# Patient Record
Sex: Female | Born: 1945 | Race: White | Hispanic: No | Marital: Married | Smoking: Never smoker
Health system: Southern US, Community
[De-identification: ages and names within clinical notes are randomized; demographics above are authoritative.]

## PROBLEM LIST (undated history)

## (undated) DIAGNOSIS — C50919 Malignant neoplasm of unspecified site of unspecified female breast: Secondary | ICD-10-CM

## (undated) DIAGNOSIS — R011 Cardiac murmur, unspecified: Secondary | ICD-10-CM

## (undated) DIAGNOSIS — J302 Other seasonal allergic rhinitis: Secondary | ICD-10-CM

## (undated) DIAGNOSIS — I00 Rheumatic fever without heart involvement: Secondary | ICD-10-CM

## (undated) HISTORY — PX: SKIN CANCER EXCISION: SHX779

## (undated) HISTORY — PX: ABDOMINAL HYSTERECTOMY: SHX81

## (undated) HISTORY — PX: TONSILLECTOMY: SUR1361

## (undated) HISTORY — PX: APPENDECTOMY: SHX54

---

## 2015-10-17 DIAGNOSIS — J209 Acute bronchitis, unspecified: Secondary | ICD-10-CM | POA: Diagnosis not present

## 2015-10-17 DIAGNOSIS — Z789 Other specified health status: Secondary | ICD-10-CM | POA: Diagnosis not present

## 2016-05-15 DIAGNOSIS — Z713 Dietary counseling and surveillance: Secondary | ICD-10-CM | POA: Diagnosis not present

## 2016-05-15 DIAGNOSIS — Z299 Encounter for prophylactic measures, unspecified: Secondary | ICD-10-CM | POA: Diagnosis not present

## 2016-05-15 DIAGNOSIS — J019 Acute sinusitis, unspecified: Secondary | ICD-10-CM | POA: Diagnosis not present

## 2016-05-23 DIAGNOSIS — Z6834 Body mass index (BMI) 34.0-34.9, adult: Secondary | ICD-10-CM | POA: Diagnosis not present

## 2016-05-23 DIAGNOSIS — Z299 Encounter for prophylactic measures, unspecified: Secondary | ICD-10-CM | POA: Diagnosis not present

## 2016-05-23 DIAGNOSIS — M549 Dorsalgia, unspecified: Secondary | ICD-10-CM | POA: Diagnosis not present

## 2016-05-23 DIAGNOSIS — K219 Gastro-esophageal reflux disease without esophagitis: Secondary | ICD-10-CM | POA: Diagnosis not present

## 2016-05-27 DIAGNOSIS — M545 Low back pain: Secondary | ICD-10-CM | POA: Diagnosis not present

## 2016-05-27 DIAGNOSIS — Z6834 Body mass index (BMI) 34.0-34.9, adult: Secondary | ICD-10-CM | POA: Diagnosis not present

## 2016-05-27 DIAGNOSIS — Z299 Encounter for prophylactic measures, unspecified: Secondary | ICD-10-CM | POA: Diagnosis not present

## 2016-05-27 DIAGNOSIS — Z789 Other specified health status: Secondary | ICD-10-CM | POA: Diagnosis not present

## 2016-06-02 DIAGNOSIS — M9983 Other biomechanical lesions of lumbar region: Secondary | ICD-10-CM | POA: Diagnosis not present

## 2016-06-02 DIAGNOSIS — M545 Low back pain: Secondary | ICD-10-CM | POA: Diagnosis not present

## 2016-06-02 DIAGNOSIS — Z9889 Other specified postprocedural states: Secondary | ICD-10-CM | POA: Diagnosis not present

## 2016-06-02 DIAGNOSIS — M79604 Pain in right leg: Secondary | ICD-10-CM | POA: Diagnosis not present

## 2016-06-04 DIAGNOSIS — K219 Gastro-esophageal reflux disease without esophagitis: Secondary | ICD-10-CM | POA: Diagnosis not present

## 2016-06-04 DIAGNOSIS — M549 Dorsalgia, unspecified: Secondary | ICD-10-CM | POA: Diagnosis not present

## 2016-06-04 DIAGNOSIS — Z299 Encounter for prophylactic measures, unspecified: Secondary | ICD-10-CM | POA: Diagnosis not present

## 2016-06-04 DIAGNOSIS — Z789 Other specified health status: Secondary | ICD-10-CM | POA: Diagnosis not present

## 2016-06-04 DIAGNOSIS — Z6834 Body mass index (BMI) 34.0-34.9, adult: Secondary | ICD-10-CM | POA: Diagnosis not present

## 2016-06-13 DIAGNOSIS — M545 Low back pain: Secondary | ICD-10-CM | POA: Diagnosis not present

## 2016-07-02 DIAGNOSIS — M5126 Other intervertebral disc displacement, lumbar region: Secondary | ICD-10-CM | POA: Diagnosis not present

## 2016-07-02 DIAGNOSIS — Z01812 Encounter for preprocedural laboratory examination: Secondary | ICD-10-CM | POA: Diagnosis not present

## 2016-07-07 DIAGNOSIS — Z9889 Other specified postprocedural states: Secondary | ICD-10-CM | POA: Diagnosis not present

## 2016-07-07 DIAGNOSIS — M5116 Intervertebral disc disorders with radiculopathy, lumbar region: Secondary | ICD-10-CM | POA: Diagnosis not present

## 2016-07-07 DIAGNOSIS — M5126 Other intervertebral disc displacement, lumbar region: Secondary | ICD-10-CM | POA: Diagnosis not present

## 2016-07-16 DIAGNOSIS — M5116 Intervertebral disc disorders with radiculopathy, lumbar region: Secondary | ICD-10-CM | POA: Diagnosis not present

## 2016-12-08 DIAGNOSIS — H5319 Other subjective visual disturbances: Secondary | ICD-10-CM | POA: Diagnosis not present

## 2016-12-08 DIAGNOSIS — H9319 Tinnitus, unspecified ear: Secondary | ICD-10-CM | POA: Diagnosis not present

## 2016-12-08 DIAGNOSIS — Z6834 Body mass index (BMI) 34.0-34.9, adult: Secondary | ICD-10-CM | POA: Diagnosis not present

## 2016-12-08 DIAGNOSIS — Z1211 Encounter for screening for malignant neoplasm of colon: Secondary | ICD-10-CM | POA: Diagnosis not present

## 2016-12-08 DIAGNOSIS — Z7189 Other specified counseling: Secondary | ICD-10-CM | POA: Diagnosis not present

## 2016-12-08 DIAGNOSIS — R5383 Other fatigue: Secondary | ICD-10-CM | POA: Diagnosis not present

## 2016-12-08 DIAGNOSIS — E559 Vitamin D deficiency, unspecified: Secondary | ICD-10-CM | POA: Diagnosis not present

## 2016-12-08 DIAGNOSIS — E8940 Asymptomatic postprocedural ovarian failure: Secondary | ICD-10-CM | POA: Diagnosis not present

## 2016-12-08 DIAGNOSIS — Z0000 Encounter for general adult medical examination without abnormal findings: Secondary | ICD-10-CM | POA: Diagnosis not present

## 2016-12-08 DIAGNOSIS — Z299 Encounter for prophylactic measures, unspecified: Secondary | ICD-10-CM | POA: Diagnosis not present

## 2016-12-08 DIAGNOSIS — Z1389 Encounter for screening for other disorder: Secondary | ICD-10-CM | POA: Diagnosis not present

## 2016-12-11 DIAGNOSIS — H9313 Tinnitus, bilateral: Secondary | ICD-10-CM | POA: Diagnosis not present

## 2016-12-11 DIAGNOSIS — J0100 Acute maxillary sinusitis, unspecified: Secondary | ICD-10-CM | POA: Diagnosis not present

## 2016-12-11 DIAGNOSIS — J320 Chronic maxillary sinusitis: Secondary | ICD-10-CM | POA: Diagnosis not present

## 2017-01-06 DIAGNOSIS — E2839 Other primary ovarian failure: Secondary | ICD-10-CM | POA: Diagnosis not present

## 2017-03-19 DIAGNOSIS — H43812 Vitreous degeneration, left eye: Secondary | ICD-10-CM | POA: Diagnosis not present

## 2017-03-25 DIAGNOSIS — M76821 Posterior tibial tendinitis, right leg: Secondary | ICD-10-CM | POA: Diagnosis not present

## 2017-03-25 DIAGNOSIS — M25579 Pain in unspecified ankle and joints of unspecified foot: Secondary | ICD-10-CM | POA: Diagnosis not present

## 2017-03-25 DIAGNOSIS — M79671 Pain in right foot: Secondary | ICD-10-CM | POA: Diagnosis not present

## 2017-09-28 DIAGNOSIS — Z299 Encounter for prophylactic measures, unspecified: Secondary | ICD-10-CM | POA: Diagnosis not present

## 2017-09-28 DIAGNOSIS — J069 Acute upper respiratory infection, unspecified: Secondary | ICD-10-CM | POA: Diagnosis not present

## 2017-09-28 DIAGNOSIS — Z789 Other specified health status: Secondary | ICD-10-CM | POA: Diagnosis not present

## 2017-09-28 DIAGNOSIS — Z6835 Body mass index (BMI) 35.0-35.9, adult: Secondary | ICD-10-CM | POA: Diagnosis not present

## 2018-04-06 DIAGNOSIS — Z6835 Body mass index (BMI) 35.0-35.9, adult: Secondary | ICD-10-CM | POA: Diagnosis not present

## 2018-04-06 DIAGNOSIS — Z2821 Immunization not carried out because of patient refusal: Secondary | ICD-10-CM | POA: Diagnosis not present

## 2018-04-06 DIAGNOSIS — B3789 Other sites of candidiasis: Secondary | ICD-10-CM | POA: Diagnosis not present

## 2018-04-06 DIAGNOSIS — Z299 Encounter for prophylactic measures, unspecified: Secondary | ICD-10-CM | POA: Diagnosis not present

## 2018-04-20 DIAGNOSIS — Z299 Encounter for prophylactic measures, unspecified: Secondary | ICD-10-CM | POA: Diagnosis not present

## 2018-04-20 DIAGNOSIS — Z6835 Body mass index (BMI) 35.0-35.9, adult: Secondary | ICD-10-CM | POA: Diagnosis not present

## 2018-04-20 DIAGNOSIS — J069 Acute upper respiratory infection, unspecified: Secondary | ICD-10-CM | POA: Diagnosis not present

## 2018-05-31 DIAGNOSIS — J019 Acute sinusitis, unspecified: Secondary | ICD-10-CM | POA: Diagnosis not present

## 2018-05-31 DIAGNOSIS — Z789 Other specified health status: Secondary | ICD-10-CM | POA: Diagnosis not present

## 2018-05-31 DIAGNOSIS — Z6836 Body mass index (BMI) 36.0-36.9, adult: Secondary | ICD-10-CM | POA: Diagnosis not present

## 2018-05-31 DIAGNOSIS — Z299 Encounter for prophylactic measures, unspecified: Secondary | ICD-10-CM | POA: Diagnosis not present

## 2018-05-31 DIAGNOSIS — B351 Tinea unguium: Secondary | ICD-10-CM | POA: Diagnosis not present

## 2018-06-10 DIAGNOSIS — Z6835 Body mass index (BMI) 35.0-35.9, adult: Secondary | ICD-10-CM | POA: Diagnosis not present

## 2018-06-10 DIAGNOSIS — Z789 Other specified health status: Secondary | ICD-10-CM | POA: Diagnosis not present

## 2018-06-10 DIAGNOSIS — K449 Diaphragmatic hernia without obstruction or gangrene: Secondary | ICD-10-CM | POA: Diagnosis not present

## 2018-06-10 DIAGNOSIS — J069 Acute upper respiratory infection, unspecified: Secondary | ICD-10-CM | POA: Diagnosis not present

## 2018-06-10 DIAGNOSIS — J40 Bronchitis, not specified as acute or chronic: Secondary | ICD-10-CM | POA: Diagnosis not present

## 2018-06-10 DIAGNOSIS — R05 Cough: Secondary | ICD-10-CM | POA: Diagnosis not present

## 2018-06-10 DIAGNOSIS — Z299 Encounter for prophylactic measures, unspecified: Secondary | ICD-10-CM | POA: Diagnosis not present

## 2018-06-10 DIAGNOSIS — R509 Fever, unspecified: Secondary | ICD-10-CM | POA: Diagnosis not present

## 2018-06-15 DIAGNOSIS — B351 Tinea unguium: Secondary | ICD-10-CM | POA: Diagnosis not present

## 2018-10-05 DIAGNOSIS — C4442 Squamous cell carcinoma of skin of scalp and neck: Secondary | ICD-10-CM | POA: Diagnosis not present

## 2018-10-05 DIAGNOSIS — C44629 Squamous cell carcinoma of skin of left upper limb, including shoulder: Secondary | ICD-10-CM | POA: Diagnosis not present

## 2018-10-05 DIAGNOSIS — C44622 Squamous cell carcinoma of skin of right upper limb, including shoulder: Secondary | ICD-10-CM | POA: Diagnosis not present

## 2018-10-05 DIAGNOSIS — C4492 Squamous cell carcinoma of skin, unspecified: Secondary | ICD-10-CM

## 2018-10-05 HISTORY — DX: Squamous cell carcinoma of skin, unspecified: C44.92

## 2018-11-01 DIAGNOSIS — C44622 Squamous cell carcinoma of skin of right upper limb, including shoulder: Secondary | ICD-10-CM | POA: Diagnosis not present

## 2019-04-13 DIAGNOSIS — L080 Pyoderma: Secondary | ICD-10-CM | POA: Diagnosis not present

## 2019-04-13 DIAGNOSIS — L089 Local infection of the skin and subcutaneous tissue, unspecified: Secondary | ICD-10-CM | POA: Diagnosis not present

## 2019-07-29 DIAGNOSIS — L989 Disorder of the skin and subcutaneous tissue, unspecified: Secondary | ICD-10-CM | POA: Diagnosis not present

## 2019-07-29 DIAGNOSIS — Z6834 Body mass index (BMI) 34.0-34.9, adult: Secondary | ICD-10-CM | POA: Diagnosis not present

## 2019-07-29 DIAGNOSIS — Z808 Family history of malignant neoplasm of other organs or systems: Secondary | ICD-10-CM | POA: Diagnosis not present

## 2019-09-12 DIAGNOSIS — R5382 Chronic fatigue, unspecified: Secondary | ICD-10-CM | POA: Diagnosis not present

## 2019-09-12 DIAGNOSIS — E559 Vitamin D deficiency, unspecified: Secondary | ICD-10-CM | POA: Diagnosis not present

## 2019-09-12 DIAGNOSIS — Z8679 Personal history of other diseases of the circulatory system: Secondary | ICD-10-CM | POA: Diagnosis not present

## 2019-09-12 DIAGNOSIS — Z13220 Encounter for screening for lipoid disorders: Secondary | ICD-10-CM | POA: Diagnosis not present

## 2019-09-12 DIAGNOSIS — Z808 Family history of malignant neoplasm of other organs or systems: Secondary | ICD-10-CM | POA: Diagnosis not present

## 2019-09-15 DIAGNOSIS — Z808 Family history of malignant neoplasm of other organs or systems: Secondary | ICD-10-CM | POA: Diagnosis not present

## 2019-09-15 DIAGNOSIS — Z0000 Encounter for general adult medical examination without abnormal findings: Secondary | ICD-10-CM | POA: Diagnosis not present

## 2019-09-15 DIAGNOSIS — E559 Vitamin D deficiency, unspecified: Secondary | ICD-10-CM | POA: Diagnosis not present

## 2019-09-15 DIAGNOSIS — Z6834 Body mass index (BMI) 34.0-34.9, adult: Secondary | ICD-10-CM | POA: Diagnosis not present

## 2019-09-15 DIAGNOSIS — H9313 Tinnitus, bilateral: Secondary | ICD-10-CM | POA: Diagnosis not present

## 2020-01-26 DIAGNOSIS — Z1231 Encounter for screening mammogram for malignant neoplasm of breast: Secondary | ICD-10-CM | POA: Diagnosis not present

## 2020-02-22 DIAGNOSIS — R928 Other abnormal and inconclusive findings on diagnostic imaging of breast: Secondary | ICD-10-CM | POA: Diagnosis not present

## 2020-02-22 DIAGNOSIS — R921 Mammographic calcification found on diagnostic imaging of breast: Secondary | ICD-10-CM | POA: Diagnosis not present

## 2020-02-29 DIAGNOSIS — Z299 Encounter for prophylactic measures, unspecified: Secondary | ICD-10-CM | POA: Diagnosis not present

## 2020-02-29 DIAGNOSIS — J019 Acute sinusitis, unspecified: Secondary | ICD-10-CM | POA: Diagnosis not present

## 2020-08-08 DIAGNOSIS — Z79899 Other long term (current) drug therapy: Secondary | ICD-10-CM | POA: Diagnosis not present

## 2020-08-08 DIAGNOSIS — E559 Vitamin D deficiency, unspecified: Secondary | ICD-10-CM | POA: Diagnosis not present

## 2020-08-08 DIAGNOSIS — R5383 Other fatigue: Secondary | ICD-10-CM | POA: Diagnosis not present

## 2020-10-19 DIAGNOSIS — Z20822 Contact with and (suspected) exposure to covid-19: Secondary | ICD-10-CM | POA: Diagnosis not present

## 2020-10-31 ENCOUNTER — Other Ambulatory Visit: Payer: Self-pay | Admitting: Family Medicine

## 2020-10-31 DIAGNOSIS — R921 Mammographic calcification found on diagnostic imaging of breast: Secondary | ICD-10-CM

## 2020-11-01 ENCOUNTER — Other Ambulatory Visit: Payer: Self-pay | Admitting: Family Medicine

## 2020-11-01 DIAGNOSIS — R921 Mammographic calcification found on diagnostic imaging of breast: Secondary | ICD-10-CM

## 2020-11-09 ENCOUNTER — Ambulatory Visit
Admission: RE | Admit: 2020-11-09 | Discharge: 2020-11-09 | Disposition: A | Discharge status: Home / Self Care | Payer: Medicare Other | Source: Ambulatory Visit | Attending: Family Medicine | Admitting: Family Medicine

## 2020-11-09 ENCOUNTER — Other Ambulatory Visit: Payer: Self-pay

## 2020-11-09 DIAGNOSIS — R921 Mammographic calcification found on diagnostic imaging of breast: Secondary | ICD-10-CM

## 2020-11-16 ENCOUNTER — Other Ambulatory Visit: Payer: Self-pay | Admitting: General Surgery

## 2020-11-16 DIAGNOSIS — C50511 Malignant neoplasm of lower-outer quadrant of right female breast: Secondary | ICD-10-CM

## 2020-11-19 ENCOUNTER — Telehealth: Payer: Self-pay | Admitting: Radiation Oncology

## 2020-11-19 NOTE — Telephone Encounter (Signed)
Called patient to schedule a consultation with Dr. Lisbeth Renshaw. No answer, LMV for a return call.

## 2020-11-20 ENCOUNTER — Telehealth: Payer: Self-pay | Admitting: Hematology

## 2020-11-20 ENCOUNTER — Other Ambulatory Visit: Payer: Self-pay | Admitting: General Surgery

## 2020-11-20 DIAGNOSIS — C50511 Malignant neoplasm of lower-outer quadrant of right female breast: Secondary | ICD-10-CM

## 2020-11-20 DIAGNOSIS — Z170 Estrogen receptor positive status [ER+]: Secondary | ICD-10-CM

## 2020-11-20 NOTE — Telephone Encounter (Signed)
Received a new pt referral from Dr. Barry Dienes for a new dx of breast cancer. Jamie Pope has been cld and scheduled to see Dr. Burr Medico on 6/29 at 3pm. Pt aware to arrive 20 minutes early.

## 2020-11-21 ENCOUNTER — Ambulatory Visit
Admission: RE | Admit: 2020-11-21 | Discharge: 2020-11-21 | Disposition: A | Discharge status: Home / Self Care | Payer: Medicare Other | Source: Ambulatory Visit | Attending: General Surgery | Admitting: General Surgery

## 2020-11-21 ENCOUNTER — Other Ambulatory Visit: Payer: Self-pay

## 2020-11-21 ENCOUNTER — Ambulatory Visit: Payer: Medicare Other | Admitting: Hematology

## 2020-11-21 ENCOUNTER — Encounter (HOSPITAL_COMMUNITY)
Admission: RE | Admit: 2020-11-21 | Discharge: 2020-11-21 | Disposition: A | Discharge status: Home / Self Care | Payer: Medicare Other | Source: Ambulatory Visit | Attending: General Surgery | Admitting: General Surgery

## 2020-11-21 ENCOUNTER — Encounter (HOSPITAL_COMMUNITY): Payer: Self-pay

## 2020-11-21 DIAGNOSIS — C50511 Malignant neoplasm of lower-outer quadrant of right female breast: Secondary | ICD-10-CM

## 2020-11-21 DIAGNOSIS — D0511 Intraductal carcinoma in situ of right breast: Secondary | ICD-10-CM | POA: Insufficient documentation

## 2020-11-21 DIAGNOSIS — Z01812 Encounter for preprocedural laboratory examination: Secondary | ICD-10-CM | POA: Insufficient documentation

## 2020-11-21 DIAGNOSIS — Z170 Estrogen receptor positive status [ER+]: Secondary | ICD-10-CM

## 2020-11-21 HISTORY — DX: Cardiac murmur, unspecified: R01.1

## 2020-11-21 HISTORY — DX: Other seasonal allergic rhinitis: J30.2

## 2020-11-21 HISTORY — DX: Malignant neoplasm of unspecified site of unspecified female breast: C50.919

## 2020-11-21 HISTORY — DX: Rheumatic fever without heart involvement: I00

## 2020-11-21 LAB — CBC
HCT: 35.2 % — ABNORMAL LOW (ref 36.0–46.0)
Hemoglobin: 10.9 g/dL — ABNORMAL LOW (ref 12.0–15.0)
MCH: 25.3 pg — ABNORMAL LOW (ref 26.0–34.0)
MCHC: 31 g/dL (ref 30.0–36.0)
MCV: 81.7 fL (ref 80.0–100.0)
Platelets: 246 10*3/uL (ref 150–400)
RBC: 4.31 MIL/uL (ref 3.87–5.11)
RDW: 14.7 % (ref 11.5–15.5)
WBC: 5.3 10*3/uL (ref 4.0–10.5)
nRBC: 0 % (ref 0.0–0.2)

## 2020-11-21 NOTE — Pre-Procedure Instructions (Signed)
Jamie Pope  11/21/2020     Your procedure is scheduled on Fri., November 23, 2020 from 9:30AM-10:30AM.  Report to [facility] Entrance "A" at 7:30AM  Call this number if you have problems the morning of surgery:  [phone]   Remember:  Do not eat after midnight on June 30th  You may drink clear liquids until 3 hours (6:30AM) prior to surgery time .  Clear liquids allowed are: Water, Juice(no pulp, non-citric), Black Coffee(no dairy or creamer), Clear Tea(no dairy or creamer), Carbonated Beverages, Gatorade, Plain Jell-O, Plain Popsicles.    Take these medicines the morning of surgery with A SIP OF WATER: If Needed: Acetaminophen (TYLENOL)  As of today, STOP taking all Aspirin (unless instructed by your doctor) and Other Aspirin containing products, Vitamins, Fish oils, and Herbal medications. Also stop all NSAIDS i.e. Advil, Ibuprofen, Motrin, Aleve, Anaprox, Naproxen, BC, Goody Powders, and all Supplements.    No Smoking of any kind, Tobacco/Vaping, or Alcohol products 24 hours prior to your procedure. If you use a Cpap at night, you may bring all equipment for your overnight stay.    Day of Surgery:  Do not wear jewelry, make-up.  Do not wear lotions, powders, or perfumes, or deodorant.  Do not shave 48 hours prior to surgery.    Do not bring valuables to the hospital. Do Not wear nail polish, gel polish, artificial nails, or any other type of covering on  natural nails including finger and toenails. If patients have artificial nails, gel coating, etc. that need to be removed by a nail salon please have this removed prior to surgery or surgery may need to be canceled/delayed if the surgeon/ anesthesia feels like the patient is unable to be adequately monitored.  [facility] is not responsible for any belongings or valuables.  Contacts, dentures or bridgework may not be worn into surgery.    For patients admitted to the hospital, discharge time will be determined by  your treatment team.  Patients discharged the day of surgery will not be allowed to drive home, and someone age 51 and over needs to stay with them for 24 hours.  Special instructions:   Oral Hygiene is also important to reduce your risk of infection.  Remember - BRUSH YOUR TEETH THE MORNING OF SURGERY WITH YOUR REGULAR TOOTHPASTE  [place]- Preparing For Surgery  Before surgery, you can play an important role. Because skin is not sterile, your skin needs to be as free of germs as possible. You can reduce the number of germs on your skin by washing with CHG (chlorahexidine gluconate) Soap before surgery.  CHG is an antiseptic cleaner which kills germs and bonds with the skin to continue killing germs even after washing.    Please do not use if you have an allergy to CHG or antibacterial soaps. If your skin becomes reddened/irritated stop using the CHG.  Do not shave (including legs and underarms) for at least 48 hours prior to first CHG shower. It is OK to shave your face.  Please follow these instructions carefully.   Shower the NIGHT BEFORE SURGERY and the MORNING OF SURGERY with CHG.   If you chose to wash your hair, wash your hair first as usual with your normal shampoo.  After you shampoo, rinse your hair and body thoroughly to remove the shampoo.  Use CHG as you would any other liquid soap. You can apply CHG directly to the skin and wash gently with a scrungie  or a clean washcloth.   Apply the CHG Soap to your body ONLY FROM THE NECK DOWN.  Do not use on open wounds or open sores. Avoid contact with your eyes, ears, mouth and genitals (private parts). Wash Face and genitals (private parts)  with your normal soap.  Wash thoroughly, paying special attention to the area where your surgery will be performed.  Thoroughly rinse your body with warm water from the neck down.  DO NOT shower/wash with your normal soap after using and rinsing off the CHG Soap.  Pat yourself dry with a  CLEAN TOWEL.  Wear CLEAN PAJAMAS to bed the night before surgery, wear comfortable clothes the morning of surgery  Place CLEAN SHEETS on your bed the night of your first shower and DO NOT SLEEP WITH PETS.  Reminders: Do not apply any deodorants/lotions.  Please wear clean clothes to the hospital/surgery center.   Remember to brush your teeth WITH YOUR REGULAR TOOTHPASTE.  Please read over the following fact sheets that you were given.

## 2020-11-21 NOTE — Anesthesia Preprocedure Evaluation (Addendum)
Anesthesia Evaluation  Patient identified by MRN, date of birth, ID band Patient awake    Reviewed: Allergy & Precautions, NPO status , Patient's Chart, lab work & pertinent test results  Airway Mallampati: II  TM Distance: >3 FB Neck ROM: Full    Dental  (+) Dental Advisory Given, Edentulous Upper, Missing,    Pulmonary neg pulmonary ROS,    Pulmonary exam normal breath sounds clear to auscultation       Cardiovascular hypertension (186/79 in preop, per pt she is normally 135 SBP), Normal cardiovascular exam Rhythm:Regular Rate:Normal     Neuro/Psych negative neurological ROS  negative psych ROS   GI/Hepatic negative GI ROS, Neg liver ROS,   Endo/Other  negative endocrine ROS  Renal/GU negative Renal ROS  negative genitourinary   Musculoskeletal negative musculoskeletal ROS (+)   Abdominal   Peds negative pediatric ROS (+)  Hematology  (+) Blood dyscrasia, anemia , hct 35.2   Anesthesia Other Findings Right breast ca   Reproductive/Obstetrics negative OB ROS                            Anesthesia Physical Anesthesia Plan  ASA: 2  Anesthesia Plan: General   Post-op Pain Management:    Induction: Intravenous  PONV Risk Score and Plan: 3 and Ondansetron, Dexamethasone, Midazolam and Treatment may vary due to age or medical condition  Airway Management Planned: LMA  Additional Equipment: None  Intra-op Plan:   Post-operative Plan: Extubation in OR  Informed Consent: I have reviewed the patients History and Physical, chart, labs and discussed the procedure including the risks, benefits and alternatives for the proposed anesthesia with the patient or authorized representative who has indicated his/her understanding and acceptance.     Dental advisory given  Plan Discussed with: CRNA  Anesthesia Plan Comments: (PAT note written by Myra Gianotti, PA-C. )      Anesthesia  Quick Evaluation

## 2020-11-21 NOTE — Progress Notes (Signed)
PCP - Dr. Brigitte Pulse [facility]  Cardiologist - Denies  Chest x-ray - Denies  EKG - Denies  Stress Test - Denies  ECHO - Denies  Cardiac Cath - Denies  AICD-na PM-na LOOP-na  Sleep Study - Denies CPAP - Denies  LABS- 11/21/20: CBC  ASA- Denies  ERAS- No  HA1C- Denies  Anesthesia- Yes- per guidelines- PA Ebony Hail made aware  Pt denies having chest pain, sob, or fever at this time. All instructions explained to the pt, with a verbal understanding of the material. Pt agrees to go over the instructions while at home for a better understanding. Pt also instructed to wear a mask and social distance is she has to go out prior to her surgery. The opportunity to ask questions was provided.    Coronavirus Screening  Have you experienced the following symptoms:  Cough yes/no: No Fever (>100.26F)  yes/no: No Runny nose yes/no: No Sore throat yes/no: No Difficulty breathing/shortness of breath  yes/no: No  Have you or a family member traveled in the last 14 days and where? yes/no: No   If the patient indicates "YES" to the above questions, their PAT will be rescheduled to limit the exposure to others and, the surgeon will be notified. THE PATIENT WILL NEED TO BE ASYMPTOMATIC FOR 14 DAYS.   If the patient is not experiencing any of these symptoms, the PAT nurse will instruct them to NOT bring anyone with them to their appointment since they may have these symptoms or traveled as well.   Please remind your patients and families that hospital visitation restrictions are in effect and the importance of the restrictions.

## 2020-11-21 NOTE — Progress Notes (Addendum)
Anesthesia PAT Evaluation:  Case: [id number] Date/Time: 11/23/20 0915   Procedure: RIGHT BREAST LUMPECTOMY WITH RADIOACTIVE SEED LOCALIZATION (Right: Breast)   Anesthesia type: General   Pre-op diagnosis: RIGHT BREAST CANCER   Location: [place] OR ROOM 02 / [place] OR   Surgeons: Stark Klein, MD       DISCUSSION: Patient is a 75 year old female scheduled for the above procedure. She was an add-on to 11/21/20 PAT for seed implant that same day. Patient evaluated during her 11/21/20 PAT visit.   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 + COVID-19 test (05/28/19 and 10/19/20).  Patient reported rheumatic fever as a 75 year old, but not known issues since, other than what sounds like overnight observation at [facility] ~ 2015 after she felt light-headed with chest burning after cleaning with bleach. She reported normal EKG and cardiac testing, likely stress test. She reported murmur no longer heard. I did not auscultate a murmur on exam. Heart RRR. Lungs clear. No carotid bruits noted. Mild pedal edema. She denied chest pain, SOB, syncope. She has stable DOE with activities such as climbing stairs.   Elevated BP at PAT without HTN diagnosis. Reportedly, BP usually not that high but came in rushed to PAT because she thought she was late. Previous BP 136/80 on 11/16/20 at [facility].   Anesthesia team to evaluate on the day of surgery.    VS: BP (!) 173/96   Pulse 70   Temp 36.6 C (Oral)   Resp 18   Ht 5\' 6"  (1.676 m)   Wt 84.5 kg   SpO2 97%   BMI 30.07 kg/m  Provider wore N95 and universal face mask. Patient wore face mask.    PROVIDERS: Monico Blitz, MD is PCP    LABS: Labs reviewed: Acceptable for surgery. (all labs ordered are listed, but only abnormal results are displayed)  Labs Reviewed  CBC    EKG: N/A   CV: She reported  normal cardiac testing (likely stress test) at [facility] ~ 2015.   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    Myra Gianotti, PA-C Surgical Short Stay/Anesthesiology [facility] Phone [phone] [facility] Phone [phone] 11/22/2020 9:29 AM

## 2020-11-23 ENCOUNTER — Ambulatory Visit (HOSPITAL_COMMUNITY): Payer: Medicare Other | Admitting: Vascular Surgery

## 2020-11-23 ENCOUNTER — Encounter: Payer: Self-pay | Admitting: Radiation Oncology

## 2020-11-23 ENCOUNTER — Ambulatory Visit (HOSPITAL_COMMUNITY)
Admission: RE | Admit: 2020-11-23 | Discharge: 2020-11-23 | Disposition: A | Discharge status: Home / Self Care | Payer: Medicare Other | Attending: General Surgery | Admitting: General Surgery

## 2020-11-23 ENCOUNTER — Ambulatory Visit: Admission: RE | Admit: 2020-11-23 | Payer: Medicare Other | Source: Ambulatory Visit

## 2020-11-23 ENCOUNTER — Encounter (HOSPITAL_COMMUNITY): Payer: Self-pay | Admitting: General Surgery

## 2020-11-23 ENCOUNTER — Encounter (HOSPITAL_COMMUNITY)
Admission: RE | Disposition: A | Discharge status: Home / Self Care | Payer: Self-pay | Source: Home / Self Care | Attending: General Surgery

## 2020-11-23 ENCOUNTER — Other Ambulatory Visit: Payer: Self-pay

## 2020-11-23 ENCOUNTER — Ambulatory Visit (HOSPITAL_COMMUNITY): Payer: Medicare Other | Admitting: Anesthesiology

## 2020-11-23 ENCOUNTER — Ambulatory Visit
Admission: RE | Admit: 2020-11-23 | Discharge: 2020-11-23 | Disposition: A | Discharge status: Home / Self Care | Payer: Medicare Other | Source: Ambulatory Visit | Attending: General Surgery | Admitting: General Surgery

## 2020-11-23 DIAGNOSIS — D0511 Intraductal carcinoma in situ of right breast: Secondary | ICD-10-CM | POA: Insufficient documentation

## 2020-11-23 DIAGNOSIS — Z885 Allergy status to narcotic agent status: Secondary | ICD-10-CM | POA: Diagnosis not present

## 2020-11-23 DIAGNOSIS — Z8249 Family history of ischemic heart disease and other diseases of the circulatory system: Secondary | ICD-10-CM | POA: Diagnosis not present

## 2020-11-23 DIAGNOSIS — Z8049 Family history of malignant neoplasm of other genital organs: Secondary | ICD-10-CM | POA: Insufficient documentation

## 2020-11-23 DIAGNOSIS — Z888 Allergy status to other drugs, medicaments and biological substances status: Secondary | ICD-10-CM | POA: Insufficient documentation

## 2020-11-23 DIAGNOSIS — Z8041 Family history of malignant neoplasm of ovary: Secondary | ICD-10-CM | POA: Insufficient documentation

## 2020-11-23 DIAGNOSIS — Z803 Family history of malignant neoplasm of breast: Secondary | ICD-10-CM | POA: Insufficient documentation

## 2020-11-23 DIAGNOSIS — Z170 Estrogen receptor positive status [ER+]: Secondary | ICD-10-CM | POA: Insufficient documentation

## 2020-11-23 DIAGNOSIS — C50911 Malignant neoplasm of unspecified site of right female breast: Secondary | ICD-10-CM | POA: Diagnosis present

## 2020-11-23 HISTORY — PX: BREAST LUMPECTOMY WITH RADIOACTIVE SEED LOCALIZATION: SHX6424

## 2020-11-23 SURGERY — BREAST LUMPECTOMY WITH RADIOACTIVE SEED LOCALIZATION
Anesthesia: General | Site: Breast | Laterality: Right

## 2020-11-23 MED ORDER — PROPOFOL 10 MG/ML IV BOLUS
INTRAVENOUS | Status: AC
  Filled 2020-11-23: qty 20

## 2020-11-23 MED ORDER — CHLORHEXIDINE GLUCONATE 0.12 % MT SOLN: 15.0000 mL | Freq: Once | OROMUCOSAL | Status: AC

## 2020-11-23 MED ORDER — ONDANSETRON HCL 4 MG/2ML IJ SOLN: 4.0000 mg | Freq: Once | INTRAMUSCULAR | Status: DC | PRN

## 2020-11-23 MED ORDER — LIDOCAINE HCL 1 % IJ SOLN
INTRAMUSCULAR | Status: AC
  Filled 2020-11-23: qty 20

## 2020-11-23 MED ORDER — CHLORHEXIDINE GLUCONATE 0.12 % MT SOLN
OROMUCOSAL | Status: AC
  Administered 2020-11-23: 15 mL via OROMUCOSAL
  Filled 2020-11-23: qty 15

## 2020-11-23 MED ORDER — PHENYLEPHRINE HCL (PRESSORS) 10 MG/ML IV SOLN
INTRAVENOUS | Status: DC | PRN
  Administered 2020-11-23 (×5): 80 ug via INTRAVENOUS

## 2020-11-23 MED ORDER — CHLORHEXIDINE GLUCONATE CLOTH 2 % EX PADS: 6.0000 | MEDICATED_PAD | Freq: Once | CUTANEOUS | Status: DC

## 2020-11-23 MED ORDER — ACETAMINOPHEN 500 MG PO TABS: 1000.0000 mg | ORAL_TABLET | Freq: Once | ORAL | Status: AC

## 2020-11-23 MED ORDER — FENTANYL CITRATE (PF) 250 MCG/5ML IJ SOLN
INTRAMUSCULAR | Status: DC | PRN
  Administered 2020-11-23: 50 ug via INTRAVENOUS
  Administered 2020-11-23: 25 ug via INTRAVENOUS

## 2020-11-23 MED ORDER — LIDOCAINE HCL 1 % IJ SOLN
INTRAMUSCULAR | Status: DC | PRN
  Administered 2020-11-23: 30 mL

## 2020-11-23 MED ORDER — LACTATED RINGERS IV SOLN: INTRAVENOUS | Status: DC

## 2020-11-23 MED ORDER — OXYCODONE HCL 5 MG/5ML PO SOLN: 5.0000 mg | Freq: Once | ORAL | Status: DC | PRN

## 2020-11-23 MED ORDER — PROPOFOL 10 MG/ML IV BOLUS
INTRAVENOUS | Status: DC | PRN
  Administered 2020-11-23: 150 mg via INTRAVENOUS
  Administered 2020-11-23: 50 mg via INTRAVENOUS

## 2020-11-23 MED ORDER — ORAL CARE MOUTH RINSE: 15.0000 mL | Freq: Once | OROMUCOSAL | Status: AC

## 2020-11-23 MED ORDER — OXYCODONE HCL 5 MG PO TABS: 5.0000 mg | ORAL_TABLET | Freq: Four times a day (QID) | ORAL | 0 refills | Status: DC | PRN

## 2020-11-23 MED ORDER — CEFAZOLIN SODIUM-DEXTROSE 2-4 GM/100ML-% IV SOLN
2.0000 g | INTRAVENOUS | Status: AC
  Administered 2020-11-23: 2 g via INTRAVENOUS
  Filled 2020-11-23: qty 100

## 2020-11-23 MED ORDER — FENTANYL CITRATE (PF) 100 MCG/2ML IJ SOLN: 25.0000 ug | INTRAMUSCULAR | Status: DC | PRN

## 2020-11-23 MED ORDER — BUPIVACAINE-EPINEPHRINE (PF) 0.25% -1:200000 IJ SOLN
INTRAMUSCULAR | Status: AC
  Filled 2020-11-23: qty 30

## 2020-11-23 MED ORDER — ONDANSETRON HCL 4 MG/2ML IJ SOLN
INTRAMUSCULAR | Status: DC | PRN
  Administered 2020-11-23: 4 mg via INTRAVENOUS

## 2020-11-23 MED ORDER — 0.9 % SODIUM CHLORIDE (POUR BTL) OPTIME
TOPICAL | Status: DC | PRN
  Administered 2020-11-23: 1000 mL

## 2020-11-23 MED ORDER — DEXAMETHASONE SODIUM PHOSPHATE 10 MG/ML IJ SOLN
INTRAMUSCULAR | Status: DC | PRN
  Administered 2020-11-23: 5 mg via INTRAVENOUS

## 2020-11-23 MED ORDER — LIDOCAINE 2% (20 MG/ML) 5 ML SYRINGE
INTRAMUSCULAR | Status: DC | PRN
  Administered 2020-11-23: 60 mg via INTRAVENOUS

## 2020-11-23 MED ORDER — ACETAMINOPHEN 500 MG PO TABS
ORAL_TABLET | ORAL | Status: AC
  Administered 2020-11-23: 1000 mg via ORAL
  Filled 2020-11-23: qty 2

## 2020-11-23 MED ORDER — OXYCODONE HCL 5 MG PO TABS
5.0000 mg | ORAL_TABLET | Freq: Once | ORAL | Status: DC | PRN
Start: 2020-11-23 — End: 2020-11-23

## 2020-11-23 MED ORDER — FENTANYL CITRATE (PF) 250 MCG/5ML IJ SOLN
INTRAMUSCULAR | Status: AC
  Filled 2020-11-23: qty 5

## 2020-11-23 MED ORDER — ACETAMINOPHEN 500 MG PO TABS: 1000.0000 mg | ORAL_TABLET | ORAL | Status: AC

## 2020-11-23 SURGICAL SUPPLY — 43 items
ADH SKN CLS APL DERMABOND .7 (GAUZE/BANDAGES/DRESSINGS) ×1
APL PRP STRL LF DISP 70% ISPRP (MISCELLANEOUS) ×1
BAG COUNTER SPONGE SURGICOUNT (BAG) ×2 IMPLANT
BAG SPNG CNTER NS LX DISP (BAG) ×1
BINDER BREAST LRG (GAUZE/BANDAGES/DRESSINGS) IMPLANT
BINDER BREAST XLRG (GAUZE/BANDAGES/DRESSINGS) ×2 IMPLANT
BLADE SURG 10 STRL SS (BLADE) ×2 IMPLANT
CANISTER SUCT 3000ML PPV (MISCELLANEOUS) IMPLANT
CHLORAPREP W/TINT 26 (MISCELLANEOUS) ×2 IMPLANT
CLIP VESOCCLUDE LG 6/CT (CLIP) ×2 IMPLANT
CLIP VESOCCLUDE MED 6/CT (CLIP) ×2 IMPLANT
COVER PROBE W GEL 5X96 (DRAPES) ×2 IMPLANT
COVER SURGICAL LIGHT HANDLE (MISCELLANEOUS) ×2 IMPLANT
DERMABOND ADVANCED (GAUZE/BANDAGES/DRESSINGS) ×1
DERMABOND ADVANCED .7 DNX12 (GAUZE/BANDAGES/DRESSINGS) ×1 IMPLANT
DEVICE DUBIN SPECIMEN MAMMOGRA (MISCELLANEOUS) ×2 IMPLANT
DRAPE CHEST BREAST 15X10 FENES (DRAPES) ×2 IMPLANT
DRSG PAD ABDOMINAL 8X10 ST (GAUZE/BANDAGES/DRESSINGS) ×2 IMPLANT
ELECT COATED BLADE 2.86 ST (ELECTRODE) ×2 IMPLANT
ELECT REM PT RETURN 9FT ADLT (ELECTROSURGICAL) ×2
ELECTRODE REM PT RTRN 9FT ADLT (ELECTROSURGICAL) ×1 IMPLANT
GAUZE SPONGE 4X4 12PLY STRL LF (GAUZE/BANDAGES/DRESSINGS) ×2 IMPLANT
GLOVE SURG ENC MOIS LTX SZ6 (GLOVE) ×2 IMPLANT
GLOVE SURG UNDER LTX SZ6.5 (GLOVE) ×2 IMPLANT
GOWN STRL REUS W/ TWL LRG LVL3 (GOWN DISPOSABLE) ×1 IMPLANT
GOWN STRL REUS W/TWL 2XL LVL3 (GOWN DISPOSABLE) ×2 IMPLANT
GOWN STRL REUS W/TWL LRG LVL3 (GOWN DISPOSABLE) ×2
KIT BASIN OR (CUSTOM PROCEDURE TRAY) ×2 IMPLANT
KIT MARKER MARGIN INK (KITS) ×2 IMPLANT
LIGHT WAVEGUIDE WIDE FLAT (MISCELLANEOUS) IMPLANT
NEEDLE HYPO 25GX1X1/2 BEV (NEEDLE) ×2 IMPLANT
NS IRRIG 1000ML POUR BTL (IV SOLUTION) IMPLANT
PACK GENERAL/GYN (CUSTOM PROCEDURE TRAY) ×2 IMPLANT
STRIP CLOSURE SKIN 1/2X4 (GAUZE/BANDAGES/DRESSINGS) ×2 IMPLANT
SUT MNCRL AB 4-0 PS2 18 (SUTURE) ×2 IMPLANT
SUT SILK 2 0 SH (SUTURE) IMPLANT
SUT VIC AB 2-0 SH 27 (SUTURE) ×2
SUT VIC AB 2-0 SH 27XBRD (SUTURE) ×1 IMPLANT
SUT VIC AB 3-0 SH 27 (SUTURE) ×2
SUT VIC AB 3-0 SH 27X BRD (SUTURE) ×1 IMPLANT
SYR CONTROL 10ML LL (SYRINGE) ×2 IMPLANT
TOWEL GREEN STERILE (TOWEL DISPOSABLE) ×2 IMPLANT
TOWEL GREEN STERILE FF (TOWEL DISPOSABLE) ×2 IMPLANT

## 2020-11-23 NOTE — Discharge Instructions (Addendum)
[facility] Office Phone Number [phone]  BREAST BIOPSY/ PARTIAL MASTECTOMY: POST OP INSTRUCTIONS  Always review your discharge instruction sheet given to you by the facility where your surgery was performed.  IF YOU HAVE DISABILITY OR FAMILY LEAVE FORMS, YOU MUST BRING THEM TO THE OFFICE FOR PROCESSING.  DO NOT GIVE THEM TO YOUR DOCTOR.  A prescription for pain medication may be given to you upon discharge.  Take your pain medication as prescribed, if needed.  If narcotic pain medicine is not needed, then you may take acetaminophen (Tylenol) or ibuprofen (Advil) as needed. Take your usually prescribed medications unless otherwise directed If you need a refill on your pain medication, please contact your pharmacy.  They will contact our office to request authorization.  Prescriptions will not be filled after 5pm or on week-ends. You should eat very light the first 24 hours after surgery, such as soup, crackers, pudding, etc.  Resume your normal diet the day after surgery. Most patients will experience some swelling and bruising in the breast.  Ice packs and a good support bra will help.  Swelling and bruising can take several days to resolve.  It is common to experience some constipation if taking pain medication after surgery.  Increasing fluid intake and taking a stool softener will usually help or prevent this problem from occurring.  A mild laxative (Milk of Magnesia or Miralax) should be taken according to package directions if there are no bowel movements after 48 hours. Unless discharge instructions indicate otherwise, you may remove your bandages 48 hours after surgery, and you may shower at that time.  You may have steri-strips (small skin tapes) in place directly over the incision.  These strips should be left on the skin for 7-10 days.   Any sutures or staples will be removed at the office during your follow-up visit. ACTIVITIES:  You may resume regular daily activities  (gradually increasing) beginning the next day.  Wearing a good support bra or sports bra (or the breast binder) minimizes pain and swelling.  You may have sexual intercourse when it is comfortable. You may drive when you no longer are taking prescription pain medication, you can comfortably wear a seatbelt, and you can safely maneuver your car and apply brakes. RETURN TO WORK:  __________1 week_______________ You should see your doctor in the office for a follow-up appointment approximately two weeks after your surgery.  Your doctor's nurse will typically make your follow-up appointment when she calls you with your pathology report.  Expect your pathology report 2-3 business days after your surgery.  You may call to check if you do not hear from us after three days.   WHEN TO CALL YOUR DOCTOR: Fever over 101.0 Nausea and/or vomiting. Extreme swelling or bruising. Continued bleeding from incision. Increased pain, redness, or drainage from the incision.  The clinic staff is available to answer your questions during regular business hours.  Please don't hesitate to call and ask to speak to one of the nurses for clinical concerns.  If you have a medical emergency, go to the nearest emergency room or call 911.  A surgeon from [facility] is always on call at the hospital.  For further questions, please visit [email or web address]   

## 2020-11-23 NOTE — Anesthesia Procedure Notes (Signed)
Procedure Name: LMA Insertion Date/Time: 11/23/2020 9:44 AM Performed by: Janace Litten, CRNA Pre-anesthesia Checklist: Patient identified, Emergency Drugs available, Suction available and Patient being monitored Patient Re-evaluated:Patient Re-evaluated prior to induction Oxygen Delivery Method: Circle System Utilized Preoxygenation: Pre-oxygenation with 100% oxygen Induction Type: IV induction LMA: LMA inserted LMA Size: 3.0 Number of attempts: 1 Placement Confirmation: positive ETCO2 and breath sounds checked- equal and bilateral Tube secured with: Tape Dental Injury: Teeth and Oropharynx as per pre-operative assessment

## 2020-11-23 NOTE — Op Note (Signed)
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 The risks, benefits, complications, treatment options, and expected outcomes were discussed with the patient. The possibilities of bleeding, infection, the need for additional procedures, failure to diagnose a condition, and creating a complication requiring other procedures or operations were discussed with the patient. The patient concurred with the proposed plan, giving informed consent.  The site of surgery properly noted/marked. The patient was taken to Operating Room # 2, identified, and the procedure verified as right breast seed localized lumpectomy.  The right breast and chest were prepped and draped in standard fashion. A circumlinear incision was made near the previously placed radioactive seeds.  Dissection was carried down around the point of maximum signal intensity. The cautery was used to perform the dissection.   The specimen was inked with the margin marker paint kit.    Specimen radiography confirmed inclusion of the mammographic lesion, the clip, and the seeds.  The background signal in the breast was zero.  Additional margins were taken on the inferior, medial, superior and posterior margins.  Hemostasis was achieved with cautery.  The cavity was marked with clips on each border other than the anterior border.  Local anesthetic was infiltrated into the surrounding tissues.  The wound was irrigated and closed with 3-0 vicryl interrupted deep dermal sutures and 4-0 monocryl running subcuticular suture.      Sterile dressings were  applied. At the end of the operation, all sponge, instrument, and needle counts were correct.   Findings: Seeds, clip in specimen.  -posterior margin is pectoralis and anterior/inferior margins are skin   Estimated Blood Loss:  min         Specimens: right breast tissue with seed         Complications:  None; patient tolerated the procedure well.         Disposition: PACU - hemodynamically stable.         Condition: stable

## 2020-11-23 NOTE — Anesthesia Postprocedure Evaluation (Signed)
Anesthesia Post Note  Patient: Jamie Pope  Procedure(s) Performed: RIGHT BREAST LUMPECTOMY WITH RADIOACTIVE SEED LOCALIZATION (Right: Breast)     Patient location during evaluation: PACU Anesthesia Type: General Level of consciousness: awake and alert, oriented and patient cooperative Pain management: pain level controlled Vital Signs Assessment: post-procedure vital signs reviewed and stable Respiratory status: spontaneous breathing, nonlabored ventilation and respiratory function stable Cardiovascular status: blood pressure returned to baseline and stable Postop Assessment: no apparent nausea or vomiting Anesthetic complications: no   No notable events documented.  Last Vitals:  Vitals:   11/23/20 1122 11/23/20 1137  BP: (!) 148/71 (!) 157/68  Pulse: 69 62  Resp: 13 13  Temp:  36.8 C  SpO2: 97% 97%    Last Pain:  Vitals:   11/23/20 1137  TempSrc:   PainSc: 0-No pain                 Pervis Hocking

## 2020-11-23 NOTE — Transfer of Care (Addendum)
Immediate Anesthesia Transfer of Care Note  Patient: Jamie Pope  Procedure(s) Performed: RIGHT BREAST LUMPECTOMY WITH RADIOACTIVE SEED LOCALIZATION (Right: Breast)  Patient Location: PACU  Anesthesia Type:General  Level of Consciousness: patient cooperative and responds to stimulation  Airway & Oxygen Therapy: Patient Spontanous Breathing and Patient connected to nasal cannula oxygen  Post-op Assessment: Report given to RN and Post -op Vital signs reviewed and stable  Post vital signs: Reviewed and stable  Last Vitals:  Vitals Value Taken Time  BP 157/68 11/23/20 1137  Temp 36.8 C 11/23/20 1137  Pulse 61 11/23/20 1139  Resp 13 11/23/20 1139  SpO2 98 % 11/23/20 1139  Vitals shown include unvalidated device data.  Last Pain:  Vitals:   11/23/20 1122  TempSrc:   PainSc: Asleep      Patients Stated Pain Goal: 3 (43/60/16 5800)  Complications: No notable events documented.

## 2020-11-23 NOTE — H&P (Signed)
Jamie Pope Appointment: 11/16/2020 9:45 AM Location: [facility] Patient #: [id number] DOB: 12/18/1945 Married / Language: Jamie Pope / Race: White Female   History of Present Illness Stark Klein MD; 11/16/2020 10:49 AM) The patient is a 75 year old female who presents with breast cancer. Pt is a lovely 75 yo F referred by Dr. Autumn Patty for a new right breast cancer 10/2020.  The patient had screening detected calcifications.  Diagnostic imaging showed 3.3 cm of punctate and linear oriented calcs in the LOQ.  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  These were intermediate grade.  She hasn't had cancer before this, but her mother had cancer.  She isn't sure what type, just that it was in her lower abdomen, and her mother was dx at age 52.  Her daughter had ovarian cancer.  She is a G2P2 with first child in her early 67s.  She works as a Scientist, water quality at [employer].    Diagnostic imaging was at [facility] and is described above.    Pathology 11/09/20 1. Breast, right, needle core biopsy, stereotactic of the anterior and posterior - DUCTAL CARCINOMA IN SITU WITH NECROSIS AND CALCIFICATIONS. - SEE MICROSCOPIC DESCRIPTION. 2. Breast, right, needle core biopsy, extent of lower outer quadrant calcifications - DUCTAL CARCINOMA IN SITU WITH NECROSIS AND CALCIFICATIONS. - SEE MICROSCOPIC DESCRIPTION. Microscopic Comment 1. -2. The DCIS has intermediate nuclear grade. Estrogen Receptor: 95%, POSITIVE, STRONG STAINING INTENSITY Progesterone Receptor: 95%, POSITIVE, STRONG STAINING INTENSITY    Past Surgical History Jamie Forehand, CNA; 11/16/2020 9:36 AM) Appendectomy   Breast Biopsy   Right. Foot Surgery   Right. Hysterectomy (not due to cancer) - Complete    Diagnostic Studies History Jamie Forehand, CNA; 11/16/2020 9:36 AM) Mammogram   within last year Pap Smear   1-5 years ago  Allergies Jamie Forehand, CNA; 11/16/2020 9:37  AM) Codeine Sulfate *ANALGESICS - OPIOID*   Allergies Reconciled    Medication History Jamie Forehand, CNA; 11/16/2020 9:37 AM) No Current Medications  Medications Reconciled   Social History Jamie Forehand, CNA; 11/16/2020 9:36 AM) Caffeine use   Coffee. No alcohol use   No drug use   Tobacco use   Never smoker.  Family History Jamie Forehand, CNA; 11/16/2020 9:36 AM) Alcohol Abuse   Father, Sister. Cervical Cancer   Mother. Hypertension   Mother. Ovarian Cancer   Daughter.  Pregnancy / Birth History Jamie Forehand, CNA; 11/16/2020 9:36 AM) Jamie Pope Age   2 Maternal age   21-25 Para   2  Other Problems Jamie Forehand, CNA; 11/16/2020 9:36 AM) Gastroesophageal Reflux Disease   Lump In Breast      Review of Systems Jamie Forehand CNA; 11/16/2020 9:36 AM) General Not Present- Appetite Loss, Chills, Fatigue, Fever, Night Sweats, Weight Gain and Weight Loss. Skin Not Present- Change in Wart/Mole, Dryness, Hives, Jaundice, New Lesions, Non-Healing Wounds, Rash and Ulcer. HEENT Not Present- Earache, Hearing Loss, Hoarseness, Nose Bleed, Oral Ulcers, Ringing in the Ears, Seasonal Allergies, Sinus Pain, Sore Throat, Visual Disturbances, Wears glasses/contact lenses and Yellow Eyes. Respiratory Not Present- Bloody sputum, Chronic Cough, Difficulty Breathing, Snoring and Wheezing. Breast Present- Breast Mass. Not Present- Breast Pain, Nipple Discharge and Skin Changes. Cardiovascular Not Present- Chest Pain, Difficulty Breathing Lying Down, Leg Cramps, Palpitations, Rapid Heart Rate, Shortness of Breath and Swelling of Extremities. Gastrointestinal Not Present- Abdominal Pain, Bloating, Bloody Stool, Change in Bowel Habits, Chronic diarrhea, Constipation, Difficulty Swallowing, Excessive gas, Gets full quickly at  meals, Hemorrhoids, Indigestion, Nausea, Rectal Pain and Vomiting. Female Genitourinary Not Present- Frequency, Nocturia, Painful Urination, Pelvic Pain and  Urgency. Neurological Not Present- Decreased Memory, Fainting, Headaches, Numbness, Seizures, Tingling, Tremor, Trouble walking and Weakness. Psychiatric Not Present- Anxiety, Bipolar, Change in Sleep Pattern, Depression, Fearful and Frequent crying. Endocrine Not Present- Cold Intolerance, Excessive Hunger, Hair Changes, Heat Intolerance, Hot flashes and New Diabetes.  Vitals Adriana Reams Alston CNA; 11/16/2020 9:38 AM) 11/16/2020 9:37 AM Weight: 187.5 lb   Height: 64 in  Body Surface Area: 1.9 m   Body Mass Index: 32.18 kg/m   Temp.: 98 F    Pulse: 80 (Regular)    P.OX: 96% (Room air) BP: 136/80(Sitting, Left Arm, Standard)       Physical Exam Stark Klein MD; 11/16/2020 10:49 AM) General Mental Status - Alert. General Appearance - Consistent with stated age. Hydration - Well hydrated. Voice - Normal.  Head and Neck Head - normocephalic, atraumatic with no lesions or palpable masses. Trachea - midline. Thyroid Gland Characteristics - normal size and consistency.  Eye Eyeball - Bilateral - Extraocular movements intact. Sclera/Conjunctiva - Bilateral - No scleral icterus.  Chest and Lung Exam Chest and lung exam reveals  - quiet, even and easy respiratory effort with no use of accessory muscles and on auscultation, normal breath sounds, no adventitious sounds and normal vocal resonance. Inspection Chest Wall - Normal. Back - normal.  Breast Note:  ptotic breasts bilaterally. right breast sl larger. bruising LOQ on right breast. no nipple retraction or nipple discharge.   Cardiovascular Cardiovascular examination reveals  - normal heart sounds, regular rate and rhythm with no murmurs and normal pedal pulses bilaterally.  Abdomen Inspection Inspection of the abdomen reveals - No Hernias. Palpation/Percussion Palpation and Percussion of the abdomen reveal - Soft, Non Tender, No Rebound tenderness, No Rigidity (guarding) and No  hepatosplenomegaly. Auscultation Auscultation of the abdomen reveals - Bowel sounds normal.  Neurologic Neurologic evaluation reveals  - alert and oriented x 3 with no impairment of recent or remote memory. Mental Status - Normal.  Musculoskeletal Global Assessment  - Note:  no gross deformities.  Normal Exam - Left - Upper Extremity Strength Normal and Lower Extremity Strength Normal. Normal Exam - Right - Upper Extremity Strength Normal and Lower Extremity Strength Normal.  Lymphatic Head & Neck  General Head & Neck Lymphatics: Bilateral - Description - Normal. Axillary  General Axillary Region: Bilateral - Description - Normal. Tenderness - Non Tender. Femoral & Inguinal  Generalized Femoral & Inguinal Lymphatics: Bilateral - Description - No Generalized lymphadenopathy.    Assessment & Plan Stark Klein MD; 11/16/2020 10:53 AM) MALIGNANT NEOPLASM OF LOWER-OUTER QUADRANT OF RIGHT BREAST OF FEMALE, ESTROGEN RECEPTOR POSITIVE (C50.511) Impression: Pt has a new diagnosis of cTis right breast cancer, hormone receptor positive.  She is a good candidate for a seed bracketed lumpectomy. I will place referrals to rad onc and med onc for consideration of external beam radiation and antihormone tx.  The surgical procedure was described to the patient. I discussed the incision type and location and that we would need radiology involved on with a wire or seed marker and/or sentinel node.  The risks and benefits of the procedure were described to the patient and she wishes to proceed.  We discussed the risks bleeding, infection, damage to other structures, need for further procedures/surgeries. We discussed the risk of seroma. The patient was advised if the area in the breast in cancer, we may need to go back to surgery  for additional tissue to obtain negative margins or for a lymph node biopsy. The patient was advised that these are the most common complications, but that others can occur  as well. They were advised against taking aspirin or other anti-inflammatory agents/blood thinners the week before surgery. Current Plans You are being scheduled for surgery - Our schedulers will call you.   You should hear from our office's scheduling department within 5 working days about the location, date, and time of surgery.  We try to make accommodations for patient's preferences in scheduling surgery, but sometimes the OR schedule or the surgeon's schedule prevents [place] from making those accommodations.  If you have not heard from our office [phone]) in 5 working days, call the office and ask for your surgeon's nurse.  If you have other questions about your diagnosis, plan, or surgery, call the office and ask for your surgeon's nurse.  Pt Education - [facility] breast cancer surgery: discussed with patient and provided information. Referred to Radiation Oncology, for evaluation and follow up (Radiation Oncology). Routine. Referred to Oncology, for evaluation and follow up (Oncology). Routine. FAMILY HISTORY OF OVARIAN CANCER (Z80.41) Impression: Refer to genetics. Current Plans Referred to Genetic Counseling, for evaluation and follow up (Medical Genetics). Routine.   Signed electronically by Stark Klein, MD (11/16/2020 10:53 AM)

## 2020-11-23 NOTE — Interval H&P Note (Signed)
History and Physical Interval Note:  11/23/2020 7:44 AM  Jamie Pope  has presented today for surgery, with the diagnosis of RIGHT BREAST CANCER.  The various methods of treatment have been discussed with the patient and family. After consideration of risks, benefits and other options for treatment, the patient has consented to  Procedure(s): RIGHT BREAST LUMPECTOMY WITH RADIOACTIVE SEED LOCALIZATION (Right) as a surgical intervention.  The patient's history has been reviewed, patient examined, no change in status, stable for surgery.  I have reviewed the patient's chart and labs.  Questions were answered to the patient's satisfaction.     Stark Klein

## 2020-11-24 ENCOUNTER — Encounter (HOSPITAL_COMMUNITY): Payer: Self-pay | Admitting: General Surgery

## 2020-11-27 ENCOUNTER — Ambulatory Visit: Payer: Medicare Other

## 2020-11-27 ENCOUNTER — Ambulatory Visit: Admission: RE | Admit: 2020-11-27 | Payer: Medicare Other | Source: Ambulatory Visit | Admitting: Radiation Oncology

## 2020-11-27 LAB — SURGICAL PATHOLOGY

## 2020-12-03 NOTE — Progress Notes (Signed)
New Breast Cancer Diagnosis: Right Breast  Did patient present with symptoms (if so, please note symptoms) or screening mammography?: Screening Mass    Location and Extent of disease :right breast. Located in the lower outer quadrant, measured 3.3 cm in greatest dimension.  Histology per Pathology Report: grade Intermediate, DCIS with calcifications and necrosis. 11/23/2020  Receptor Status: ER(positive), PR (positive), Her2-neu (), Ki-(%)  Surgeon and surgical plan, if any:  Dr. Barry Dienes - Right lumpectomy 11/23/2020 -Follow-up 12/17/2020   Medical oncologist, treatment if any:     Family History of Breast/Ovarian/Prostate Cancer: Daughter had Ovarian, Maternal second cousin had bilateral breast cancer.  Lymphedema issues, if any:  no    Pain issues, if any: No  SAFETY ISSUES: Prior radiation? No Pacemaker/ICD? No Possible current pregnancy? Hysterectomy Is the patient on methotrexate? no  Current Complaints / other details:

## 2020-12-04 ENCOUNTER — Other Ambulatory Visit: Payer: Self-pay

## 2020-12-04 ENCOUNTER — Encounter: Payer: Self-pay | Admitting: Radiation Oncology

## 2020-12-04 ENCOUNTER — Ambulatory Visit
Admission: RE | Admit: 2020-12-04 | Discharge: 2020-12-04 | Disposition: A | Discharge status: Home / Self Care | Payer: Medicare Other | Source: Ambulatory Visit | Attending: Radiation Oncology | Admitting: Radiation Oncology

## 2020-12-04 VITALS — Ht 66.0 in | Wt 186.0 lb

## 2020-12-04 DIAGNOSIS — D0511 Intraductal carcinoma in situ of right breast: Secondary | ICD-10-CM

## 2020-12-04 NOTE — Progress Notes (Signed)
Radiation Oncology         ([phone]) [phone] ________________________________  Initial Outpatient Consultation - Conducted via telephone due to current COVID-19 concerns for limiting patient exposure  I spoke with the patient to conduct this consult visit via telephone to spare the patient unnecessary potential exposure in the healthcare setting during the current COVID-19 pandemic. The patient was notified in advance and was offered a [place] meeting to allow for face to face communication but unfortunately reported that they did not have the appropriate resources/technology to support such a visit and instead preferred to proceed with a telephone consult.   Name: Jamie Pope        MRN: [id number]  Date of Service: 12/04/2020 DOB: 07/27/1945  YC:XKGY, Weldon Picking, MD  Stark Klein, MD     REFERRING PHYSICIAN: Stark Klein, MD   DIAGNOSIS: The encounter diagnosis was Ductal carcinoma in situ (DCIS) of right breast.   HISTORY OF PRESENT ILLNESS: Jamie Pope is a 75 y.o. female with a new diagnosis of right breast cancer. The patient was noted to have screening detected calcifications on mammography in the lower outer quadrant. Diagnostic imaging measured this mass at up to 3.3 cm and a stereotactic biopsies anterior and posterior on 11/09/20 were consistent with intermediate grade DCIS that was ER/PR positive. She met with Dr. Barry Dienes and underwent lumpectomy on 11/23/2020 . Final pathology revealed a 2.7 cm span of DCIS that was graded as intermediate to high-grade.  The closest margin could not be determined, but the report indicated resection margins were clear of DCIS and additional right inferior right posterior right medial and right superior margins were all clear microscopically of malignancy.  The patient is contacted today by telephone as she was unable to connect to [facility].    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   SCC KA tx with bx   Seasonal allergies        PAST SURGICAL HISTORY: Past Surgical History:  Procedure Laterality Date   ABDOMINAL HYSTERECTOMY     complete   APPENDECTOMY     BREAST LUMPECTOMY WITH RADIOACTIVE SEED LOCALIZATION Right 11/23/2020   Procedure: RIGHT BREAST LUMPECTOMY WITH RADIOACTIVE SEED LOCALIZATION;  Surgeon: Stark Klein, MD;  Location: [facility];  Service: General;  Laterality: Right;   SKIN CANCER EXCISION     Right Wrist   TONSILLECTOMY       FAMILY HISTORY:  Family History  Problem Relation Age of Onset   Ovarian cancer Daughter      SOCIAL HISTORY:  reports that she has never smoked. She has been exposed to tobacco smoke. She has never used smokeless tobacco. She reports previous alcohol use. She reports that she does not use drugs. The patient is married and lives in [place].  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  ALLERGIES: Codeine   MEDICATIONS:  Current Outpatient Medications  Medication Sig Dispense Refill   acetaminophen (TYLENOL) 500 MG tablet Take 500 mg by mouth every 6 (six) hours as needed (pain).     oxyCODONE (OXY IR/ROXICODONE) 5 MG immediate release tablet Take 1 tablet (5 mg total) by  mouth every 6 (six) hours as needed for severe pain. 10 tablet 0   No current facility-administered medications for this encounter.     REVIEW OF SYSTEMS: On review of systems, the patient reports that she is doing well overall. She reports that she has been thinking about options of having an MRI to clearly delineate her extent of disease and make sure she does not have any additional calcifications and intends to discuss this with Dr. Barry Dienes.  She reports she is otherwise healing quite well and denies any  concerns with separation of her skin, or fluid collection or redness.  No other complaints are verbalized.    PHYSICAL EXAM:  Wt Readings from Last 3 Encounters:  11/23/20 186 lb 4.6 oz (84.5 kg)  11/21/20 186 lb 4.8 oz (84.5 kg)   Unable to assess given encounter type    ECOG = 1  0 - Asymptomatic (Fully active, able to carry on all predisease activities without restriction)  1 - Symptomatic but completely ambulatory (Restricted in physically strenuous activity but ambulatory and able to carry out work of a light or sedentary nature. For example, light housework, office work)  2 - Symptomatic, <50% in bed during the day (Ambulatory and capable of all self care but unable to carry out any work activities. Up and about more than 50% of waking hours)  3 - Symptomatic, >50% in bed, but not bedbound (Capable of only limited self-care, confined to bed or chair 50% or more of waking hours)  4 - Bedbound (Completely disabled. Cannot carry on any self-care. Totally confined to bed or chair)  5 - Death   Eustace Pen MM, Creech RH, Tormey DC, et al. [phone]). "Toxicity and response criteria of the [facility] Group". [place] Oncol. 5 (6): 649-55    LABORATORY DATA:  Lab Results  Component Value Date   WBC 5.3 11/21/2020   HGB 10.9 (L) 11/21/2020   HCT 35.2 (L) 11/21/2020   MCV 81.7 11/21/2020   PLT 246 11/21/2020   No results found for: NA, K, CL, CO2 No results found for: ALT, AST, GGT, ALKPHOS, BILITOT    RADIOGRAPHY: MM Breast Surgical Specimen  Result Date: 11/23/2020 CLINICAL DATA:  Post surgical specimen following radioactive seed localization bracketing an area of DCIS. The ribbon shaped biopsy clip, localizing the more posterior extent, was displaced 4 cm medial to the biopsy calcifications. EXAM: SPECIMEN RADIOGRAPH OF THE LEFT BREAST COMPARISON:  Previous exam(s). FINDINGS: Status post excision of the left breast. Both radioactive seeds in the X shaped biopsy  clip are present, completely intact, and were marked for pathology. The displaced ribbon shaped biopsy clip was not included in the specimen. IMPRESSION: Specimen radiograph of the left breast. Electronically Signed   By: Lajean Manes M.D.   On: 11/23/2020 10:39  MM RT RADIOACTIVE SEED LOC MAMMO GUIDE  Result Date: 11/21/2020 CLINICAL DATA:  75 year old female presents for radioactive seed bracketing of DCIS calcifications within the LOWER OUTER RIGHT breast, with targeting of the X shaped biopsy clip and residual calcifications at the posterior DCIS extent. Please note that the RIBBON clip placed at time of biopsy of the posterior calcifications had migrated medially and is not targeted. EXAM: MAMMOGRAPHIC GUIDED RADIOACTIVE SEED LOCALIZATION OF THE RIGHT BREAST X 2 COMPARISON:  Previous exam(s). FINDINGS: Patient presents for radioactive seed localizations prior to RIGHT lumpectomy. I met with the patient and we discussed the procedure of seed localization including benefits and alternatives. We discussed the high  likelihood of a successful procedure. We discussed the risks of the procedure including infection, bleeding, tissue injury and further surgery. We discussed the low dose of radioactivity involved in the procedure. Informed, written consent was given. The usual time-out protocol was performed immediately prior to the procedure. MAMMOGRAPHIC GUIDED RADIOACTIVE SEED LOCALIZATION OF THE RIGHT BREAST Using mammographic guidance, sterile technique, 1% lidocaine and an I-125 radioactive seed, the X biopsy clip was localized using a LATERAL approach. The follow-up mammogram images confirm the seed in the expected location and were marked for Dr. Barry Dienes. Follow-up survey of the patient confirms presence of the radioactive seed. Order number of I-125 seed:  [id number]. Total activity:  9.030 millicuries.  Reference Date: 11/01/2020. MAMMOGRAPHIC GUIDED RADIOACTIVE SEED LOCALIZATION OF THE RIGHT BREAST Using  mammographic guidance, sterile technique, 1% lidocaine and an I-125 radioactive seed, the posterior extent of DCIS calcifications was localized using a approach. The follow-up mammogram images confirm the seed in the expected location and were marked for Dr. Barry Dienes. Follow-up survey of the patient confirms presence of the radioactive seed. Order number of I-125 seed:  [id number]. Total activity:  2.263 millicuries.  Reference Date: 11/01/2020. The 2 radioactive seeds are separated by a distance of 3.5 cm. The patient tolerated the procedures well and was released from the [facility]. She was given instructions regarding seed removal. IMPRESSION: Radioactive seed localization right breast X 2 with bracketing of the anterior and posterior extent of DCIS calcifications. The 2 seeds are separated by a distance of 3.5 cm. No apparent complications. Electronically Signed   By: Margarette Canada M.D.   On: 11/21/2020 13:56  MM RT RADIO SEED EA ADD LESION LOC MAMMO  Result Date: 11/21/2020 CLINICAL DATA:  75 year old female presents for radioactive seed bracketing of DCIS calcifications within the LOWER OUTER RIGHT breast, with targeting of the X shaped biopsy clip and residual calcifications at the posterior DCIS extent. Please note that the RIBBON clip placed at time of biopsy of the posterior calcifications had migrated medially and is not targeted. EXAM: MAMMOGRAPHIC GUIDED RADIOACTIVE SEED LOCALIZATION OF THE RIGHT BREAST X 2 COMPARISON:  Previous exam(s). FINDINGS: Patient presents for radioactive seed localizations prior to RIGHT lumpectomy. I met with the patient and we discussed the procedure of seed localization including benefits and alternatives. We discussed the high likelihood of a successful procedure. We discussed the risks of the procedure including infection, bleeding, tissue injury and further surgery. We discussed the low dose of radioactivity involved in the procedure. Informed, written consent was  given. The usual time-out protocol was performed immediately prior to the procedure. MAMMOGRAPHIC GUIDED RADIOACTIVE SEED LOCALIZATION OF THE RIGHT BREAST Using mammographic guidance, sterile technique, 1% lidocaine and an I-125 radioactive seed, the X biopsy clip was localized using a LATERAL approach. The follow-up mammogram images confirm the seed in the expected location and were marked for Dr. Barry Dienes. Follow-up survey of the patient confirms presence of the radioactive seed. Order number of I-125 seed:  [id number]. Total activity:  3.893 millicuries.  Reference Date: 11/01/2020. MAMMOGRAPHIC GUIDED RADIOACTIVE SEED LOCALIZATION OF THE RIGHT BREAST Using mammographic guidance, sterile technique, 1% lidocaine and an I-125 radioactive seed, the posterior extent of DCIS calcifications was localized using a approach. The follow-up mammogram images confirm the seed in the expected location and were marked for Dr. Barry Dienes. Follow-up survey of the patient confirms presence of the radioactive seed. Order number of I-125 seed:  [id number]. Total activity:  1.572 millicuries.  Reference Date: 11/01/2020. The 2 radioactive seeds  are separated by a distance of 3.5 cm. The patient tolerated the procedures well and was released from the Breast Center. She was given instructions regarding seed removal. IMPRESSION: Radioactive seed localization right breast X 2 with bracketing of the anterior and posterior extent of DCIS calcifications. The 2 seeds are separated by a distance of 3.5 cm. No apparent complications. Electronically Signed   By: Margarette Canada M.D.   On: 11/21/2020 13:56  MM CLIP PLACEMENT RIGHT  Result Date: 11/09/2020 CLINICAL DATA:  Status post stereotactic core needle biopsy of the anterior and posterior extents of right breast lower outer quadrant calcifications. Evaluate post biopsy marker clip placements. EXAM: DIAGNOSTIC RIGHT MAMMOGRAM POST STEREOTACTIC BIOPSY COMPARISON:  Previous exam(s). FINDINGS:  Mammographic images were obtained following stereotactic guided biopsy of right breast calcifications. The ribbon shaped biopsy clip, which was used to localize the posterior extent of the biopsied calcifications, appears displaced medially by 4 cm. The X shaped biopsy clip corresponding to the anterior extent the calcifications is well positioned. There are intervening residual calcifications. IMPRESSION: Appropriate positioning of the X shaped biopsy marking clip at the site of biopsy in the lower outer quadrant of the right breast. Ribbon shaped biopsy clip appears displaced 4 cm medially to residual calcifications in the lower outer right breast. Final Assessment: Post Procedure Mammograms for Marker Placement Electronically Signed   By: Lajean Manes M.D.   On: 11/09/2020 10:43  MM RT BREAST BX W LOC DEV 1ST LESION IMAGE BX SPEC STEREO GUIDE  Addendum Date: 11/12/2020   ADDENDUM REPORT: 11/12/2020 14:01 ADDENDUM: Pathology revealed INTERMEDIATE GRADE DUCTAL CARCINOMA IN SITU WITH NECROSIS AND CALCIFICATIONS of the RIGHT breast, lower outer quadrant, anterior extent, X clip. This was found to be concordant by Dr. Lajean Manes. Pathology revealed INTERMEDIATE GRADE DUCTAL CARCINOMA IN SITU WITH NECROSIS AND CALCIFICATIONS of the RIGHT breast, lower outer quadrant, posterior extent, ribbon clip. This was found to be concordant by Dr. Lajean Manes. Pathology results were discussed with the patient by telephone. The patient reported doing well after the biopsies with tenderness at the sites. Post biopsy instructions and care were reviewed and questions were answered. The patient was encouraged to call [facility] for any additional concerns. Per patient request, surgical consultation has been arranged with Dr. Stark Klein at [facility] on November 16, 2020. Pathology results reported by Stacie Acres RN on 11/12/2020. Electronically Signed   By: Lajean Manes M.D.   On:  11/12/2020 14:01   Result Date: 11/12/2020 CLINICAL DATA:  Patient presents for stereotactic core needle biopsy of the anterior-posterior extents lower outer quadrant right breast calcifications. EXAM: RIGHT BREAST STEREOTACTIC CORE NEEDLE BIOPSY: 2 STEREOTACTIC CORE NEEDLE BIOPSIES PERFORMED COMPARISON:  Previous exams. FINDINGS: The patient and I discussed the procedure of stereotactic-guided biopsy including benefits and alternatives. We discussed the high likelihood of a successful procedure. We discussed the risks of the procedure including infection, bleeding, tissue injury, clip migration, and inadequate sampling. Informed written consent was given. The usual time out protocol was performed immediately prior to the procedure. Using sterile technique and 1% Lidocaine as local anesthetic, under stereotactic guidance, a 9 gauge vacuum assisted device was used to perform core needle biopsy of calcifications in the lower outer quadrant the right breast, anterior extent, using a lateral approach. Specimen radiograph was performed showing calcifications for which biopsy was performed. Specimens with calcifications are identified for pathology. Lesion #1: Anterior extent of calcifications Lesion quadrant: Lower outer quadrant At  the conclusion of the procedure, an X shaped tissue marker clip was deployed into the biopsy cavity. Lesion #2: Posterior extent of calcifications Using sterile technique and 1% Lidocaine as local anesthetic, under stereotactic guidance, a 9 gauge vacuum assisted device was used to perform core needle biopsy of calcifications in the lower outer quadrant of the right breast, posterior extent using a lateral approach. Specimen radiograph was performed showing calcifications for which biopsy was performed. Specimens with calcifications are identified for pathology. Lesion quadrant: Lower outer quadrant At the conclusion of the procedure, ribbon shaped tissue marker clip was deployed into the  biopsy cavity. Follow-up 2-view mammogram was performed and dictated separately. IMPRESSION: Stereotactic-guided biopsy of the anterior-posterior extents of right breast lower outer quadrant calcifications. No apparent complications. Electronically Signed: By: Lajean Manes M.D. On: 11/09/2020 10:39  MM RT BREAST BX W LOC DEV EA AD LESION IMG BX SPEC STEREO GUIDE  Addendum Date: 11/12/2020   ADDENDUM REPORT: 11/12/2020 14:01 ADDENDUM: Pathology revealed INTERMEDIATE GRADE DUCTAL CARCINOMA IN SITU WITH NECROSIS AND CALCIFICATIONS of the RIGHT breast, lower outer quadrant, anterior extent, X clip. This was found to be concordant by Dr. Lajean Manes. Pathology revealed INTERMEDIATE GRADE DUCTAL CARCINOMA IN SITU WITH NECROSIS AND CALCIFICATIONS of the RIGHT breast, lower outer quadrant, posterior extent, ribbon clip. This was found to be concordant by Dr. Lajean Manes. Pathology results were discussed with the patient by telephone. The patient reported doing well after the biopsies with tenderness at the sites. Post biopsy instructions and care were reviewed and questions were answered. The patient was encouraged to call [facility] for any additional concerns. Per patient request, surgical consultation has been arranged with Dr. Stark Klein at [facility] on November 16, 2020. Pathology results reported by Stacie Acres RN on 11/12/2020. Electronically Signed   By: Lajean Manes M.D.   On: 11/12/2020 14:01   Result Date: 11/12/2020 CLINICAL DATA:  Patient presents for stereotactic core needle biopsy of the anterior-posterior extents lower outer quadrant right breast calcifications. EXAM: RIGHT BREAST STEREOTACTIC CORE NEEDLE BIOPSY: 2 STEREOTACTIC CORE NEEDLE BIOPSIES PERFORMED COMPARISON:  Previous exams. FINDINGS: The patient and I discussed the procedure of stereotactic-guided biopsy including benefits and alternatives. We discussed the high likelihood of a successful  procedure. We discussed the risks of the procedure including infection, bleeding, tissue injury, clip migration, and inadequate sampling. Informed written consent was given. The usual time out protocol was performed immediately prior to the procedure. Using sterile technique and 1% Lidocaine as local anesthetic, under stereotactic guidance, a 9 gauge vacuum assisted device was used to perform core needle biopsy of calcifications in the lower outer quadrant the right breast, anterior extent, using a lateral approach. Specimen radiograph was performed showing calcifications for which biopsy was performed. Specimens with calcifications are identified for pathology. Lesion #1: Anterior extent of calcifications Lesion quadrant: Lower outer quadrant At the conclusion of the procedure, an X shaped tissue marker clip was deployed into the biopsy cavity. Lesion #2: Posterior extent of calcifications Using sterile technique and 1% Lidocaine as local anesthetic, under stereotactic guidance, a 9 gauge vacuum assisted device was used to perform core needle biopsy of calcifications in the lower outer quadrant of the right breast, posterior extent using a lateral approach. Specimen radiograph was performed showing calcifications for which biopsy was performed. Specimens with calcifications are identified for pathology. Lesion quadrant: Lower outer quadrant At the conclusion of the procedure, ribbon shaped tissue marker clip was deployed into  the biopsy cavity. Follow-up 2-view mammogram was performed and dictated separately. IMPRESSION: Stereotactic-guided biopsy of the anterior-posterior extents of right breast lower outer quadrant calcifications. No apparent complications. Electronically Signed: By: Lajean Manes M.D. On: 11/09/2020 10:39      IMPRESSION/PLAN: 1. Intermediate-High grade DCIS of the right breast.  Dr. Lisbeth Renshaw has reviewed her imaging and pathology today.  She appears to be healing up well clinically by report  from our conversation.  We discussed the rationale for adjuvant therapy including antiestrogen and adjuvant radiotherapy to reduce risks of local recurrence.  The patient is interested in proceeding with medical oncology evaluation in [place] and maintaining her long-term care through oncology and surgery here in town, but given the location of where she lives and the logistics of radiotherapy would be interested in having treatment closer to home.  We did discuss the typical course of hypofractionation over 4 weeks time to the right breast and reviewed the curative intent, risks, benefits, short and long-term effects of therapy as well as delivery and logistics.  A referral was made to [facility].  I will reach out to our navigators to coordinate for her to meet with medical oncology as well.  She is encouraged to call if she has any questions or concerns or desires further evaluation in our clinic. 2. Possible genetic predisposition to malignancy. The patient is a candidate for genetic testing given her personal and family history.  The patient's daughter was diagnosed with ovarian cancer at the age of 2 and is currently being treated here in [place].  Given her daughter's history I encouraged the patient to ask her daughter if she has been offered genetic testing, the patient herself however would be a candidate as well for testing was offered referral and would like to think about her options prior to committing to referral.  Given current concerns for patient exposure during the COVID-19 pandemic, this encounter was conducted via telephone.  The patient has provided two factor identification and has given verbal consent for this type of encounter and has been advised to only accept a meeting of this type in a secure network environment. The time spent during this encounter was 60 minutes including preparation, discussion, and coordination of the patient's care. The attendants for this meeting  include Blenda Nicely, RN,  Hayden Pedro  and Rowan Blase Wentling.  During the encounter,  Blenda Nicely, RN,  and Hayden Pedro were located at [facility] Radiation Oncology Department.  Tyaira M Cashman was located at home.       Carola Rhine, [facility]     **Disclaimer: This note was dictated with voice recognition software. Similar sounding words can inadvertently be transcribed and this note may contain transcription errors which may not have been corrected upon publication of note.**

## 2020-12-05 ENCOUNTER — Telehealth: Payer: Self-pay | Admitting: Hematology and Oncology

## 2020-12-05 NOTE — Telephone Encounter (Signed)
Pt has been cld and rescheduled to see Dr. Chryl Heck on 7/25 at 1120am. Pt aware to arrive 20 minutes early.

## 2020-12-06 ENCOUNTER — Telehealth: Payer: Self-pay | Admitting: Hematology and Oncology

## 2020-12-06 ENCOUNTER — Encounter: Payer: Self-pay | Admitting: *Deleted

## 2020-12-06 ENCOUNTER — Telehealth: Payer: Self-pay | Admitting: *Deleted

## 2020-12-06 NOTE — Telephone Encounter (Signed)
Pt has declined to schedule any further appts for med or radonc. I updated Dr. Lisbeth Renshaw and Breast navigator.

## 2020-12-06 NOTE — Telephone Encounter (Signed)
Received phone call from [facility], she stated that she called the patient with an appt., and the patient stated that the patient stated that she doesn't need any chemo or radiation

## 2020-12-11 ENCOUNTER — Ambulatory Visit: Payer: Medicare Other | Admitting: Hematology

## 2020-12-17 ENCOUNTER — Other Ambulatory Visit: Payer: Medicare Other

## 2020-12-17 ENCOUNTER — Ambulatory Visit: Payer: Medicare Other | Admitting: Hematology and Oncology

## 2021-05-31 DIAGNOSIS — Z20822 Contact with and (suspected) exposure to covid-19: Secondary | ICD-10-CM | POA: Diagnosis not present

## 2021-06-05 DIAGNOSIS — Z9889 Other specified postprocedural states: Secondary | ICD-10-CM | POA: Diagnosis not present

## 2021-06-05 DIAGNOSIS — D0511 Intraductal carcinoma in situ of right breast: Secondary | ICD-10-CM | POA: Diagnosis not present

## 2021-06-05 DIAGNOSIS — R922 Inconclusive mammogram: Secondary | ICD-10-CM | POA: Diagnosis not present

## 2021-06-11 DIAGNOSIS — R0981 Nasal congestion: Secondary | ICD-10-CM | POA: Diagnosis not present

## 2021-06-11 DIAGNOSIS — J329 Chronic sinusitis, unspecified: Secondary | ICD-10-CM | POA: Diagnosis not present

## 2021-06-11 DIAGNOSIS — Z299 Encounter for prophylactic measures, unspecified: Secondary | ICD-10-CM | POA: Diagnosis not present

## 2021-06-11 DIAGNOSIS — C50919 Malignant neoplasm of unspecified site of unspecified female breast: Secondary | ICD-10-CM | POA: Diagnosis not present

## 2021-06-17 DIAGNOSIS — C50511 Malignant neoplasm of lower-outer quadrant of right female breast: Secondary | ICD-10-CM | POA: Diagnosis not present

## 2021-06-17 DIAGNOSIS — Z170 Estrogen receptor positive status [ER+]: Secondary | ICD-10-CM | POA: Diagnosis not present

## 2021-06-17 DIAGNOSIS — R921 Mammographic calcification found on diagnostic imaging of breast: Secondary | ICD-10-CM | POA: Diagnosis not present

## 2021-07-09 DIAGNOSIS — Z299 Encounter for prophylactic measures, unspecified: Secondary | ICD-10-CM | POA: Diagnosis not present

## 2021-07-09 DIAGNOSIS — Z6834 Body mass index (BMI) 34.0-34.9, adult: Secondary | ICD-10-CM | POA: Diagnosis not present

## 2021-07-09 DIAGNOSIS — G250 Essential tremor: Secondary | ICD-10-CM | POA: Diagnosis not present

## 2021-07-09 DIAGNOSIS — H9190 Unspecified hearing loss, unspecified ear: Secondary | ICD-10-CM | POA: Diagnosis not present

## 2021-07-09 DIAGNOSIS — C50919 Malignant neoplasm of unspecified site of unspecified female breast: Secondary | ICD-10-CM | POA: Diagnosis not present

## 2021-08-13 DIAGNOSIS — Z7189 Other specified counseling: Secondary | ICD-10-CM | POA: Diagnosis not present

## 2021-08-13 DIAGNOSIS — Z6833 Body mass index (BMI) 33.0-33.9, adult: Secondary | ICD-10-CM | POA: Diagnosis not present

## 2021-08-13 DIAGNOSIS — Z79899 Other long term (current) drug therapy: Secondary | ICD-10-CM | POA: Diagnosis not present

## 2021-08-13 DIAGNOSIS — Z0000 Encounter for general adult medical examination without abnormal findings: Secondary | ICD-10-CM | POA: Diagnosis not present

## 2021-08-13 DIAGNOSIS — Z1331 Encounter for screening for depression: Secondary | ICD-10-CM | POA: Diagnosis not present

## 2021-08-13 DIAGNOSIS — E559 Vitamin D deficiency, unspecified: Secondary | ICD-10-CM | POA: Diagnosis not present

## 2021-08-13 DIAGNOSIS — Z299 Encounter for prophylactic measures, unspecified: Secondary | ICD-10-CM | POA: Diagnosis not present

## 2021-08-13 DIAGNOSIS — R011 Cardiac murmur, unspecified: Secondary | ICD-10-CM | POA: Diagnosis not present

## 2021-08-13 DIAGNOSIS — Z1211 Encounter for screening for malignant neoplasm of colon: Secondary | ICD-10-CM | POA: Diagnosis not present

## 2021-08-13 DIAGNOSIS — R5383 Other fatigue: Secondary | ICD-10-CM | POA: Diagnosis not present

## 2021-08-13 DIAGNOSIS — E7800 Pure hypercholesterolemia, unspecified: Secondary | ICD-10-CM | POA: Diagnosis not present

## 2021-08-13 DIAGNOSIS — Z6835 Body mass index (BMI) 35.0-35.9, adult: Secondary | ICD-10-CM | POA: Diagnosis not present

## 2021-08-26 DIAGNOSIS — R011 Cardiac murmur, unspecified: Secondary | ICD-10-CM | POA: Diagnosis not present

## 2021-08-29 DIAGNOSIS — Z713 Dietary counseling and surveillance: Secondary | ICD-10-CM | POA: Diagnosis not present

## 2021-08-29 DIAGNOSIS — Z6833 Body mass index (BMI) 33.0-33.9, adult: Secondary | ICD-10-CM | POA: Diagnosis not present

## 2021-08-29 DIAGNOSIS — R42 Dizziness and giddiness: Secondary | ICD-10-CM | POA: Diagnosis not present

## 2021-08-29 DIAGNOSIS — R011 Cardiac murmur, unspecified: Secondary | ICD-10-CM | POA: Diagnosis not present

## 2021-08-29 DIAGNOSIS — Z299 Encounter for prophylactic measures, unspecified: Secondary | ICD-10-CM | POA: Diagnosis not present

## 2021-09-03 ENCOUNTER — Encounter (HOSPITAL_COMMUNITY): Payer: Self-pay

## 2021-09-23 ENCOUNTER — Ambulatory Visit (INDEPENDENT_AMBULATORY_CARE_PROVIDER_SITE_OTHER): Payer: Medicare Other | Admitting: Dermatology

## 2021-09-23 ENCOUNTER — Encounter: Payer: Self-pay | Admitting: Dermatology

## 2021-09-23 DIAGNOSIS — Z85828 Personal history of other malignant neoplasm of skin: Secondary | ICD-10-CM

## 2021-09-23 DIAGNOSIS — C44622 Squamous cell carcinoma of skin of right upper limb, including shoulder: Secondary | ICD-10-CM | POA: Diagnosis not present

## 2021-09-23 DIAGNOSIS — L570 Actinic keratosis: Secondary | ICD-10-CM

## 2021-09-23 DIAGNOSIS — Z1283 Encounter for screening for malignant neoplasm of skin: Secondary | ICD-10-CM

## 2021-09-23 DIAGNOSIS — D485 Neoplasm of uncertain behavior of skin: Secondary | ICD-10-CM

## 2021-09-23 NOTE — Patient Instructions (Signed)

## 2021-10-11 ENCOUNTER — Encounter: Payer: Self-pay | Admitting: Dermatology

## 2021-10-11 NOTE — Progress Notes (Signed)
   Follow-Up Visit   Subjective  Jamie Pope is a 76 y.o. female who presents for the following: Skin Problem (Patient here today for lesion on her right hand x 2 weeks per patient no bleeding. Per patient she does feel a aching sensation in her hand at night. ).  Symptomatic growth right dorsal hand several other crusts Location:  Duration:  Quality:  Associated Signs/Symptoms: Modifying Factors:  Severity:  Timing: Context:   Objective  Well appearing patient in no apparent distress; mood and affect are within normal limits. Sun exposed areas plus back examined: No atypical pigmented lesions.  1 possible new nonmelanoma skin cancer right dorsal hand will be biopsied and treated.  Right Wrist - Posterior Scar from Mohs surgery done in late 2020 shows no sign of recurrence  Right Hand - Posterior Growth over just 1 to 2 months dorsal right hand; patient has concerned that it is a recurrence from her previous skin cancer.  This pink volcano shaped 7 mm nodule appears to be a new squamous cell carcinoma/KA.       Left Hand - Posterior, Right Forearm - Posterior Multiple flat pink gritty crusts, historically stable and not currently bothering patient    All sun exposed areas plus back examined.   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  Skin / nail biopsy Type of biopsy: tangential   Informed consent: discussed and consent obtained   Timeout: patient name, date of birth, surgical site, and procedure verified   Anesthesia: the lesion was anesthetized in a standard fashion   Anesthetic:  1% lidocaine w/ epinephrine 1-100,000 local infiltration Instrument used: flexible razor blade   Hemostasis achieved with: ferric subsulfate   Outcome: patient tolerated  procedure well   Post-procedure details: wound care instructions given    Destruction of lesion Complexity: simple   Destruction method: electrodesiccation and curettage   Informed consent: discussed and consent obtained   Timeout:  patient name, date of birth, surgical site, and procedure verified Anesthesia: the lesion was anesthetized in a standard fashion   Anesthetic:  1% lidocaine w/ epinephrine 1-100,000 local infiltration Curettage performed in three different directions: Yes   Curettage cycles:  3 Lesion length (cm):  1 Lesion width (cm):  1 Margin per side (cm):  0 Final wound size (cm):  1 Hemostasis achieved with:  aluminum chloride Outcome: patient tolerated procedure well with no complications   Post-procedure details: wound care instructions given    Specimen 1 - Surgical pathology Differential Diagnosis: scc vs bcc txpbx Check Margins: No  Shave biopsy showed a deeper pearl of keratin wound was curetted and cauterized.  AK (actinic keratosis) (2) Left Hand - Posterior; Right Forearm - Posterior  Intervention deferred      I, Lavonna Monarch, MD, have reviewed all documentation for this visit.  The documentation on 10/11/21 for the exam, diagnosis, procedures, and orders are all accurate and complete.

## 2021-12-04 DIAGNOSIS — R921 Mammographic calcification found on diagnostic imaging of breast: Secondary | ICD-10-CM | POA: Diagnosis not present

## 2021-12-13 DIAGNOSIS — Z713 Dietary counseling and surveillance: Secondary | ICD-10-CM | POA: Diagnosis not present

## 2021-12-13 DIAGNOSIS — K219 Gastro-esophageal reflux disease without esophagitis: Secondary | ICD-10-CM | POA: Diagnosis not present

## 2021-12-13 DIAGNOSIS — Z299 Encounter for prophylactic measures, unspecified: Secondary | ICD-10-CM | POA: Diagnosis not present

## 2021-12-13 DIAGNOSIS — Z6833 Body mass index (BMI) 33.0-33.9, adult: Secondary | ICD-10-CM | POA: Diagnosis not present

## 2021-12-13 DIAGNOSIS — C50919 Malignant neoplasm of unspecified site of unspecified female breast: Secondary | ICD-10-CM | POA: Diagnosis not present

## 2022-02-11 DIAGNOSIS — Z299 Encounter for prophylactic measures, unspecified: Secondary | ICD-10-CM | POA: Diagnosis not present

## 2022-02-11 DIAGNOSIS — R42 Dizziness and giddiness: Secondary | ICD-10-CM | POA: Diagnosis not present

## 2022-02-11 DIAGNOSIS — E7800 Pure hypercholesterolemia, unspecified: Secondary | ICD-10-CM | POA: Diagnosis not present

## 2022-02-11 DIAGNOSIS — Z6834 Body mass index (BMI) 34.0-34.9, adult: Secondary | ICD-10-CM | POA: Diagnosis not present

## 2022-02-11 DIAGNOSIS — R5383 Other fatigue: Secondary | ICD-10-CM | POA: Diagnosis not present

## 2022-02-11 DIAGNOSIS — Z23 Encounter for immunization: Secondary | ICD-10-CM | POA: Diagnosis not present

## 2022-03-19 DIAGNOSIS — D0511 Intraductal carcinoma in situ of right breast: Secondary | ICD-10-CM | POA: Diagnosis not present

## 2022-03-19 DIAGNOSIS — R928 Other abnormal and inconclusive findings on diagnostic imaging of breast: Secondary | ICD-10-CM | POA: Diagnosis not present

## 2022-03-26 DIAGNOSIS — M810 Age-related osteoporosis without current pathological fracture: Secondary | ICD-10-CM | POA: Diagnosis not present

## 2022-03-26 DIAGNOSIS — D0511 Intraductal carcinoma in situ of right breast: Secondary | ICD-10-CM | POA: Diagnosis not present

## 2022-03-26 DIAGNOSIS — M8580 Other specified disorders of bone density and structure, unspecified site: Secondary | ICD-10-CM | POA: Diagnosis not present

## 2022-06-02 DIAGNOSIS — Z299 Encounter for prophylactic measures, unspecified: Secondary | ICD-10-CM | POA: Diagnosis not present

## 2022-06-02 DIAGNOSIS — U071 COVID-19: Secondary | ICD-10-CM | POA: Diagnosis not present

## 2022-06-02 DIAGNOSIS — R509 Fever, unspecified: Secondary | ICD-10-CM | POA: Diagnosis not present

## 2022-06-11 ENCOUNTER — Other Ambulatory Visit: Payer: Self-pay | Admitting: Radiation Oncology

## 2022-06-11 DIAGNOSIS — R921 Mammographic calcification found on diagnostic imaging of breast: Secondary | ICD-10-CM | POA: Diagnosis not present

## 2022-06-11 DIAGNOSIS — R928 Other abnormal and inconclusive findings on diagnostic imaging of breast: Secondary | ICD-10-CM | POA: Diagnosis not present

## 2022-06-11 DIAGNOSIS — D0511 Intraductal carcinoma in situ of right breast: Secondary | ICD-10-CM | POA: Diagnosis not present

## 2022-06-19 ENCOUNTER — Ambulatory Visit
Admission: RE | Admit: 2022-06-19 | Discharge: 2022-06-19 | Disposition: A | Discharge status: Home / Self Care | Payer: Medicare Other | Source: Ambulatory Visit | Attending: Radiation Oncology | Admitting: Radiation Oncology

## 2022-06-19 ENCOUNTER — Other Ambulatory Visit: Payer: Self-pay | Admitting: Radiation Oncology

## 2022-06-19 DIAGNOSIS — R921 Mammographic calcification found on diagnostic imaging of breast: Secondary | ICD-10-CM

## 2022-06-19 DIAGNOSIS — D0511 Intraductal carcinoma in situ of right breast: Secondary | ICD-10-CM

## 2022-06-19 DIAGNOSIS — N6012 Diffuse cystic mastopathy of left breast: Secondary | ICD-10-CM | POA: Diagnosis not present

## 2022-06-19 HISTORY — PX: BREAST BIOPSY: SHX20

## 2022-06-25 DIAGNOSIS — D0511 Intraductal carcinoma in situ of right breast: Secondary | ICD-10-CM | POA: Diagnosis not present

## 2022-07-15 DIAGNOSIS — J019 Acute sinusitis, unspecified: Secondary | ICD-10-CM | POA: Diagnosis not present

## 2022-07-15 DIAGNOSIS — Z299 Encounter for prophylactic measures, unspecified: Secondary | ICD-10-CM | POA: Diagnosis not present

## 2022-07-15 DIAGNOSIS — R509 Fever, unspecified: Secondary | ICD-10-CM | POA: Diagnosis not present

## 2022-08-15 DIAGNOSIS — E559 Vitamin D deficiency, unspecified: Secondary | ICD-10-CM | POA: Diagnosis not present

## 2022-08-15 DIAGNOSIS — Z1339 Encounter for screening examination for other mental health and behavioral disorders: Secondary | ICD-10-CM | POA: Diagnosis not present

## 2022-08-15 DIAGNOSIS — Z1331 Encounter for screening for depression: Secondary | ICD-10-CM | POA: Diagnosis not present

## 2022-08-15 DIAGNOSIS — Z299 Encounter for prophylactic measures, unspecified: Secondary | ICD-10-CM | POA: Diagnosis not present

## 2022-08-15 DIAGNOSIS — Z79899 Other long term (current) drug therapy: Secondary | ICD-10-CM | POA: Diagnosis not present

## 2022-08-15 DIAGNOSIS — Z0000 Encounter for general adult medical examination without abnormal findings: Secondary | ICD-10-CM | POA: Diagnosis not present

## 2022-08-15 DIAGNOSIS — R5383 Other fatigue: Secondary | ICD-10-CM | POA: Diagnosis not present

## 2022-08-15 DIAGNOSIS — Z7189 Other specified counseling: Secondary | ICD-10-CM | POA: Diagnosis not present

## 2022-08-15 DIAGNOSIS — E7800 Pure hypercholesterolemia, unspecified: Secondary | ICD-10-CM | POA: Diagnosis not present

## 2022-08-15 DIAGNOSIS — Z6833 Body mass index (BMI) 33.0-33.9, adult: Secondary | ICD-10-CM | POA: Diagnosis not present

## 2022-08-25 DIAGNOSIS — I351 Nonrheumatic aortic (valve) insufficiency: Secondary | ICD-10-CM | POA: Diagnosis not present

## 2022-09-04 DIAGNOSIS — R002 Palpitations: Secondary | ICD-10-CM | POA: Diagnosis not present

## 2022-09-04 DIAGNOSIS — I10 Essential (primary) hypertension: Secondary | ICD-10-CM | POA: Diagnosis not present

## 2022-09-04 DIAGNOSIS — Z299 Encounter for prophylactic measures, unspecified: Secondary | ICD-10-CM | POA: Diagnosis not present

## 2022-09-18 DIAGNOSIS — Z299 Encounter for prophylactic measures, unspecified: Secondary | ICD-10-CM | POA: Diagnosis not present

## 2022-09-18 DIAGNOSIS — I10 Essential (primary) hypertension: Secondary | ICD-10-CM | POA: Diagnosis not present

## 2022-09-18 DIAGNOSIS — R42 Dizziness and giddiness: Secondary | ICD-10-CM | POA: Diagnosis not present

## 2022-10-01 IMAGING — MG MM BREAST LOCALIZATION CLIP
6 series · 6 of 14 positions shown · non-contrast
Comparison: Previous exam(s).

CLINICAL DATA: Status post stereotactic core needle biopsy of the
anterior and posterior extents of right breast lower outer quadrant
calcifications. Evaluate post biopsy marker clip placements.

EXAM:
DIAGNOSTIC RIGHT MAMMOGRAM POST STEREOTACTIC BIOPSY

[R CC (1 of 2)]
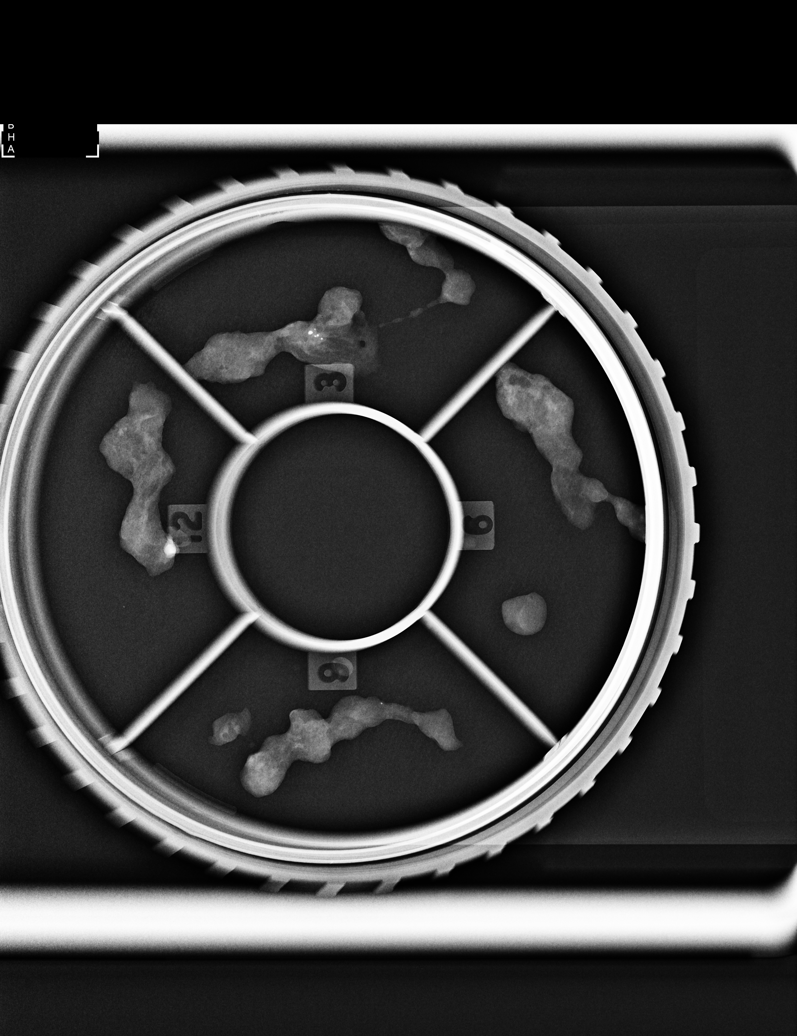

[R CC (2 of 2)]
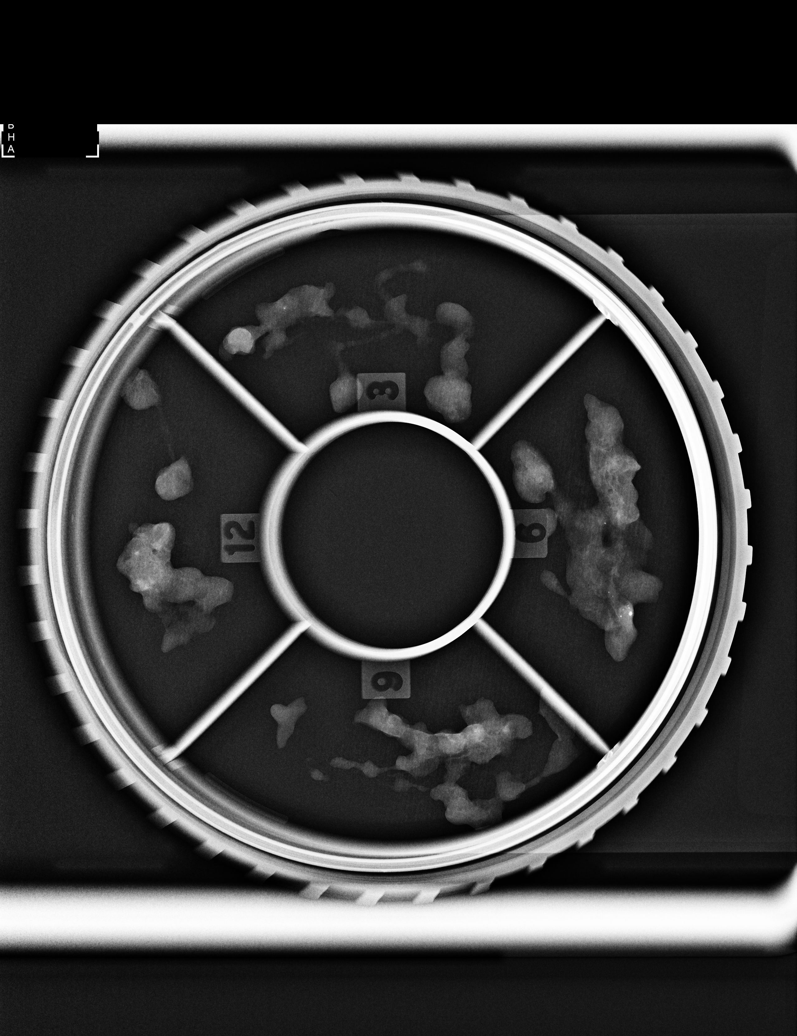

[R CC synth-2D]
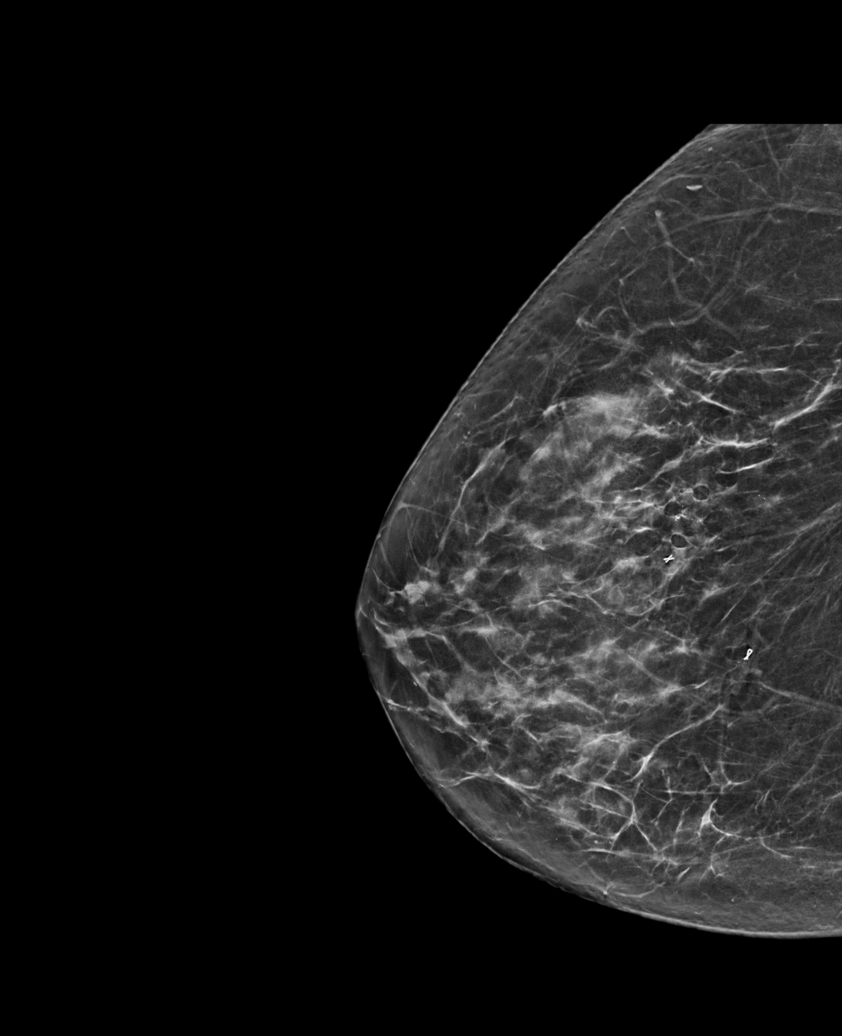

[R LM synth-2D]
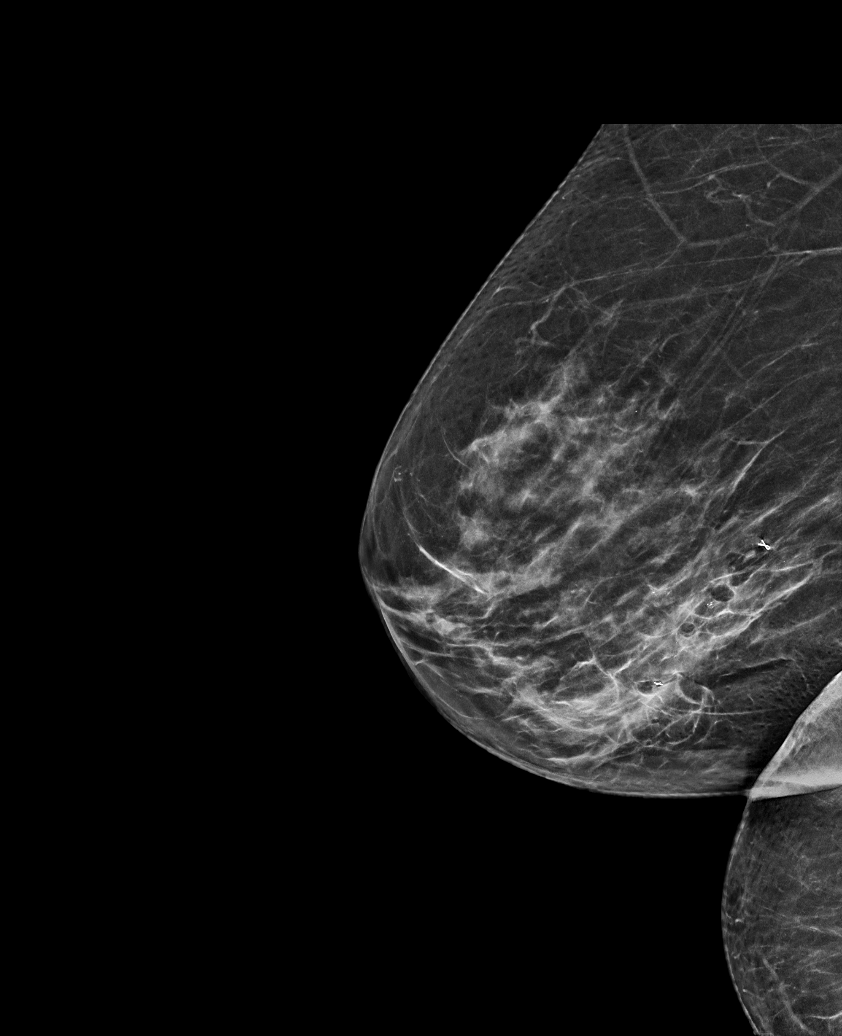

[R LM tomo · tomo slice 33/66.0]
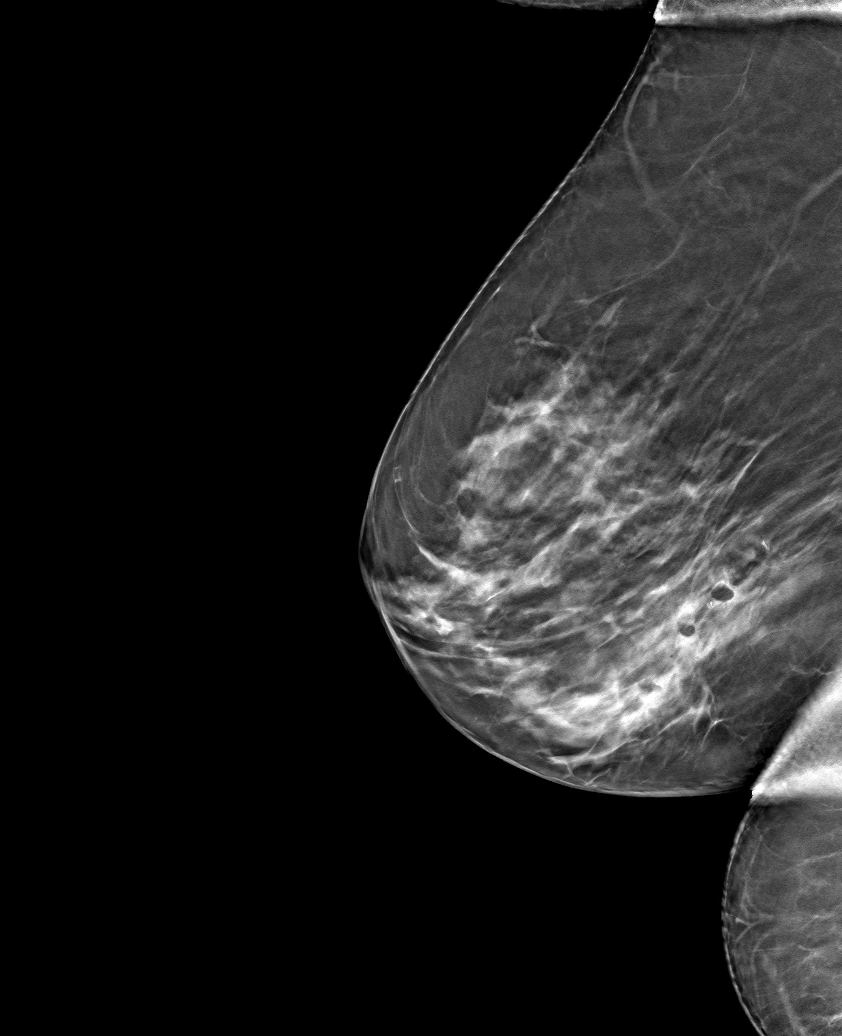

[R CC tomo · tomo slice 32/63.0]
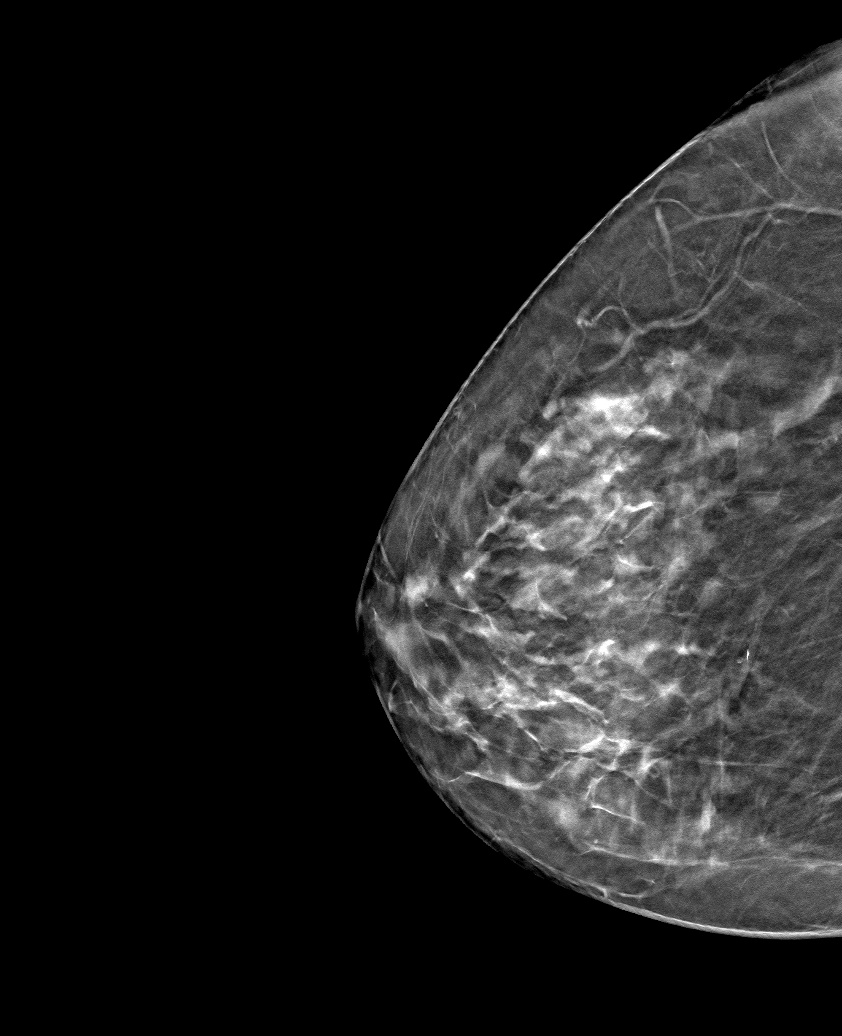

[6 of 14 positions shown; findings below may reference images not displayed]

FINDINGS: Mammographic images were obtained following stereotactic guided
biopsy of right breast calcifications. The ribbon shaped biopsy
clip, which was used to localize the posterior extent of the
biopsied calcifications, appears displaced medially by 4 cm. The X
shaped biopsy clip corresponding to the anterior extent the
calcifications is well positioned. There are intervening residual
calcifications.
IMPRESSION: Appropriate positioning of the X shaped biopsy marking clip at the
site of biopsy in the lower outer quadrant of the right breast.

Ribbon shaped biopsy clip appears displaced 4 cm medially to
residual calcifications in the lower outer right breast.

Final Assessment: Post Procedure Mammograms for Marker Placement

## 2022-10-17 DIAGNOSIS — Z299 Encounter for prophylactic measures, unspecified: Secondary | ICD-10-CM | POA: Diagnosis not present

## 2022-10-17 DIAGNOSIS — I10 Essential (primary) hypertension: Secondary | ICD-10-CM | POA: Diagnosis not present

## 2022-10-17 DIAGNOSIS — M10071 Idiopathic gout, right ankle and foot: Secondary | ICD-10-CM | POA: Diagnosis not present

## 2022-10-17 DIAGNOSIS — G250 Essential tremor: Secondary | ICD-10-CM | POA: Diagnosis not present

## 2023-01-29 DIAGNOSIS — I10 Essential (primary) hypertension: Secondary | ICD-10-CM | POA: Diagnosis not present

## 2023-01-29 DIAGNOSIS — Z299 Encounter for prophylactic measures, unspecified: Secondary | ICD-10-CM | POA: Diagnosis not present

## 2023-01-29 DIAGNOSIS — L309 Dermatitis, unspecified: Secondary | ICD-10-CM | POA: Diagnosis not present

## 2023-01-29 DIAGNOSIS — Z1211 Encounter for screening for malignant neoplasm of colon: Secondary | ICD-10-CM | POA: Diagnosis not present

## 2023-01-29 DIAGNOSIS — K625 Hemorrhage of anus and rectum: Secondary | ICD-10-CM | POA: Diagnosis not present

## 2023-02-02 ENCOUNTER — Encounter (INDEPENDENT_AMBULATORY_CARE_PROVIDER_SITE_OTHER): Payer: Self-pay | Admitting: *Deleted

## 2023-02-09 ENCOUNTER — Ambulatory Visit (INDEPENDENT_AMBULATORY_CARE_PROVIDER_SITE_OTHER): Payer: Medicare Other | Admitting: Gastroenterology

## 2023-02-09 ENCOUNTER — Encounter (INDEPENDENT_AMBULATORY_CARE_PROVIDER_SITE_OTHER): Payer: Self-pay | Admitting: Gastroenterology

## 2023-02-09 VITALS — BP 151/80 | HR 69 | Temp 97.1°F | Ht 66.0 in | Wt 181.9 lb

## 2023-02-09 DIAGNOSIS — I10 Essential (primary) hypertension: Secondary | ICD-10-CM | POA: Diagnosis not present

## 2023-02-09 DIAGNOSIS — K922 Gastrointestinal hemorrhage, unspecified: Secondary | ICD-10-CM | POA: Diagnosis not present

## 2023-02-09 DIAGNOSIS — K921 Melena: Secondary | ICD-10-CM | POA: Diagnosis not present

## 2023-02-09 MED ORDER — PEG 3350-KCL-NA BICARB-NACL 420 G PO SOLR: 4000.0000 mL | Freq: Once | ORAL | 0 refills | Status: AC

## 2023-02-09 NOTE — Patient Instructions (Signed)
It was very nice to meet you today, as dicussed with will plan for the following :  1) colonoscopy

## 2023-02-09 NOTE — Progress Notes (Signed)
Vista Lawman , M.D. Gastroenterology & Hepatology [facility] [address] [place], [place] [address] Primary Care Physician: Kirstie Peri, MD [address] [place] [address]  Chief Complaint: Painless hematochezia  History of Present Illness: Jamie Pope is a 77 y.o. female with HTN who presents for evaluation of painless hematochezia  Patient reports that since June 2024 she has noticed painless blood per rectum, which is fresh initially started with only but now she is noticing dripping in the toilet bowel.  Patient denies constipation and has a good bowel movement every day without straining.  She has occasional heartburn which is controlled with lifestyle modification as she tries to avoid fatty and spicy food.The patient denies having any nausea, vomiting, fever, chills, hematochezia, melena, hematemesis, abdominal distention, abdominal pain, diarrhea, jaundice, pruritus or weight loss.  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    at age 67   SCC (squamous cell carcinoma) 10/05/2018   SCC KA Right wrist radial MOHS   SCC (squamous cell carcinoma) 10/05/2018   SCC KA tx with bx   Seasonal allergies     Past Surgical History: Past Surgical History:  Procedure Laterality Date   ABDOMINAL HYSTERECTOMY     complete   APPENDECTOMY     BREAST BIOPSY Left 06/19/2022   MM LT BREAST BX W LOC DEV 1ST LESION IMAGE BX SPEC STEREO GUIDE 06/19/2022 [facility] MAMMOGRAP[facility]   BREAST LUMPECTOMY WITH RADIOACTIVE SEED LOCALIZATION Right 11/23/2020   Procedure: RIGHT BREAST LUMPECTOMY WITH RADIOACTIVE SEED  LOCALIZATION;  Surgeon: Almond Lint, MD;  Location: [facility] OR;  Service: General;  Laterality: Right;   SKIN CANCER EXCISION     Right Wrist   TONSILLECTOMY      Family History: Family History  Problem Relation Age of Onset   Ovarian cancer Daughter     Social History: Social History   Tobacco Use  Smoking Status Never   Passive exposure: Past  Smokeless Tobacco Never   Social History   Substance and Sexual Activity  Alcohol Use Not Currently   Social History   Substance and Sexual Activity  Drug Use Never    Allergies: Allergies  Allergen Reactions   Codeine Nausea And Vomiting    Medications: Current Outpatient Medications  Medication Sig Dispense Refill   acetaminophen (TYLENOL) 500 MG tablet Take 500 mg by mouth every 6 (six) hours as needed (pain).     calcium carbonate (OS-CAL) 1250 (500 Ca) MG chewable tablet Chew 1 tablet by mouth daily.     Vitamin D, Ergocalciferol, (DRISDOL) 1.25 MG (50000 UNIT) CAPS capsule Take by mouth daily.     No current facility-administered medications for this visit.    Review of Systems: GENERAL: negative for malaise, night sweats HEENT: No changes in hearing or vision, no nose bleeds or other nasal problems. NECK: Negative for lumps, goiter, pain and significant neck swelling RESPIRATORY: Negative for cough, wheezing CARDIOVASCULAR: Negative for chest pain, leg swelling, palpitations, orthopnea GI: SEE HPI MUSCULOSKELETAL: Negative for joint pain or swelling, back pain, and muscle pain. SKIN: Negative for lesions, rash HEMATOLOGY Negative for prolonged bleeding, bruising easily, and swollen nodes. ENDOCRINE: Negative  for cold or heat intolerance, polyuria, polydipsia and goiter. NEURO: negative for tremor, gait imbalance, syncope and seizures. The remainder of the review of systems is noncontributory.   Physical Exam: BP (!) 151/80 (BP Location: Left Arm, Patient Position: Sitting, Cuff Size: Large)   Pulse 69    Temp (!) 97.1 F (36.2 C) (Temporal)   Ht 5\' 6"  (1.676 m)   Wt 181 lb 14.4 oz (82.5 kg)   BMI 29.36 kg/m  GENERAL: The patient is AO x3, in no acute distress. HEENT: Head is normocephalic and atraumatic. EOMI are intact. Mouth is well hydrated and without lesions. NECK: Supple. No masses LUNGS: Clear to auscultation. No presence of rhonchi/wheezing/rales. Adequate chest expansion HEART: RRR, normal s1 and s2. ABDOMEN: Soft, nontender, no guarding, no peritoneal signs, and nondistended. BS +. No masses. EXTREMITIES: Without any cyanosis, clubbing, rash, lesions or edema. NEUROLOGIC: AOx3, no focal motor deficit. SKIN: no jaundice, no rashes   Imaging/Labs: as above     Latest Ref Rng & Units 11/21/2020   12:00 PM  CBC  WBC 4.0 - 10.5 K/uL 5.3   Hemoglobin 12.0 - 15.0 g/dL 55.7   Hematocrit 32.2 - 46.0 % 35.2   Platelets 150 - 400 K/uL 246    No results found for: "IRON", "TIBC", "FERRITIN"  Labs from 08/15/2022 hemoglobin 11.9 MCV 81 platelet 230 normal BUN/creatinine alk phos 132 AST 2020  I personally reviewed and interpreted the available labs, imaging and endoscopic files.  Impression and Plan: Jamie Pope is a 77 y.o. female with HTN who presents for evaluation of painless hematochezia  #Painless hematochezia   Lower GI bleed likely secondary to hemorrhoidal bleed but need to rule out advanced polyp or malignancy given advanced age  Patient reports 13 years ago she had similar presentation with colonoscopy reported to have any polyps has not had repeat since then  No underlying constipation but also noticed blood with liquid stool  Proceed with diagnostic colonoscopy  #Hypertension The patient was found to have elevated blood pressure when vital signs were checked in the office. The blood pressure was rechecked by the nursing staff and it was found be persistently elevated >140/90 mmHg. I personally advised to the patient to follow up closely with PCP for  hypertension control.   All questions were answered.      Vista Lawman, MD Gastroenterology and Hepatology [facility]   This chart has been completed using [facility] Dictation software, and while attempts have been made to ensure accuracy , certain words and phrases may not be transcribed as intended

## 2023-02-09 NOTE — H&P (View-Only) (Signed)
Vista Lawman , M.D. Gastroenterology & Hepatology [facility] [address] [place], [place] [address] Primary Care Physician: Kirstie Peri, MD [address] [place] [address]  Chief Complaint: Painless hematochezia  History of Present Illness: Jamie Pope is a 77 y.o. female with HTN who presents for evaluation of painless hematochezia  Patient reports that since June 2024 she has noticed painless blood per rectum, which is fresh initially started with only but now she is noticing dripping in the toilet bowel.  Patient denies constipation and has a good bowel movement every day without straining.  She has occasional heartburn which is controlled with lifestyle modification as she tries to avoid fatty and spicy food.The patient denies having any nausea, vomiting, fever, chills, hematochezia, melena, hematemesis, abdominal distention, abdominal pain, diarrhea, jaundice, pruritus or weight loss.  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    at age 67   SCC (squamous cell carcinoma) 10/05/2018   SCC KA Right wrist radial MOHS   SCC (squamous cell carcinoma) 10/05/2018   SCC KA tx with bx   Seasonal allergies     Past Surgical History: Past Surgical History:  Procedure Laterality Date   ABDOMINAL HYSTERECTOMY     complete   APPENDECTOMY     BREAST BIOPSY Left 06/19/2022   MM LT BREAST BX W LOC DEV 1ST LESION IMAGE BX SPEC STEREO GUIDE 06/19/2022 [facility]MOGRAP[facility]   BREAST LUMPECTOMY WITH RADIOACTIVE SEED LOCALIZATION Right 11/23/2020   Procedure: RIGHT BREAST LUMPECTOMY WITH RADIOACTIVE SEED  LOCALIZATION;  Surgeon: Almond Lint, MD;  Location: [facility] OR;  Service: General;  Laterality: Right;   SKIN CANCER EXCISION     Right Wrist   TONSILLECTOMY      Family History: Family History  Problem Relation Age of Onset   Ovarian cancer Daughter     Social History: Social History   Tobacco Use  Smoking Status Never   Passive exposure: Past  Smokeless Tobacco Never   Social History   Substance and Sexual Activity  Alcohol Use Not Currently   Social History   Substance and Sexual Activity  Drug Use Never    Allergies: Allergies  Allergen Reactions   Codeine Nausea And Vomiting    Medications: Current Outpatient Medications  Medication Sig Dispense Refill   acetaminophen (TYLENOL) 500 MG tablet Take 500 mg by mouth every 6 (six) hours as needed (pain).     calcium carbonate (OS-CAL) 1250 (500 Ca) MG chewable tablet Chew 1 tablet by mouth daily.     Vitamin D, Ergocalciferol, (DRISDOL) 1.25 MG (50000 UNIT) CAPS capsule Take by mouth daily.     No current facility-administered medications for this visit.    Review of Systems: GENERAL: negative for malaise, night sweats HEENT: No changes in hearing or vision, no nose bleeds or other nasal problems. NECK: Negative for lumps, goiter, pain and significant neck swelling RESPIRATORY: Negative for cough, wheezing CARDIOVASCULAR: Negative for chest pain, leg swelling, palpitations, orthopnea GI: SEE HPI MUSCULOSKELETAL: Negative for joint pain or swelling, back pain, and muscle pain. SKIN: Negative for lesions, rash HEMATOLOGY Negative for prolonged bleeding, bruising easily, and swollen nodes. ENDOCRINE: Negative  for cold or heat intolerance, polyuria, polydipsia and goiter. NEURO: negative for tremor, gait imbalance, syncope and seizures. The remainder of the review of systems is noncontributory.   Physical Exam: BP (!) 151/80 (BP Location: Left Arm, Patient Position: Sitting, Cuff Size: Large)   Pulse 69    Temp (!) 97.1 F (36.2 C) (Temporal)   Ht 5\' 6"  (1.676 m)   Wt 181 lb 14.4 oz (82.5 kg)   BMI 29.36 kg/m  GENERAL: The patient is AO x3, in no acute distress. HEENT: Head is normocephalic and atraumatic. EOMI are intact. Mouth is well hydrated and without lesions. NECK: Supple. No masses LUNGS: Clear to auscultation. No presence of rhonchi/wheezing/rales. Adequate chest expansion HEART: RRR, normal s1 and s2. ABDOMEN: Soft, nontender, no guarding, no peritoneal signs, and nondistended. BS +. No masses. EXTREMITIES: Without any cyanosis, clubbing, rash, lesions or edema. NEUROLOGIC: AOx3, no focal motor deficit. SKIN: no jaundice, no rashes   Imaging/Labs: as above     Latest Ref Rng & Units 11/21/2020   12:00 PM  CBC  WBC 4.0 - 10.5 K/uL 5.3   Hemoglobin 12.0 - 15.0 g/dL 55.7   Hematocrit 32.2 - 46.0 % 35.2   Platelets 150 - 400 K/uL 246    No results found for: "IRON", "TIBC", "FERRITIN"  Labs from 08/15/2022 hemoglobin 11.9 MCV 81 platelet 230 normal BUN/creatinine alk phos 132 AST 2020  I personally reviewed and interpreted the available labs, imaging and endoscopic files.  Impression and Plan: UNKOWN Jamie Pope is a 77 y.o. female with HTN who presents for evaluation of painless hematochezia  #Painless hematochezia   Lower GI bleed likely secondary to hemorrhoidal bleed but need to rule out advanced polyp or malignancy given advanced age  Patient reports 13 years ago she had similar presentation with colonoscopy reported to have any polyps has not had repeat since then  No underlying constipation but also noticed blood with liquid stool  Proceed with diagnostic colonoscopy  #Hypertension The patient was found to have elevated blood pressure when vital signs were checked in the office. The blood pressure was rechecked by the nursing staff and it was found be persistently elevated >140/90 mmHg. I personally advised to the patient to follow up closely with PCP for  hypertension control.   All questions were answered.      Vista Lawman, MD Gastroenterology and Hepatology [facility]astroenter[facility]   This chart has been completed using [facility] Dictation software, and while attempts have been made to ensure accuracy , certain words and phrases may not be transcribed as intended

## 2023-02-11 ENCOUNTER — Telehealth (INDEPENDENT_AMBULATORY_CARE_PROVIDER_SITE_OTHER): Payer: Self-pay | Admitting: Gastroenterology

## 2023-02-11 ENCOUNTER — Encounter (INDEPENDENT_AMBULATORY_CARE_PROVIDER_SITE_OTHER): Payer: Self-pay

## 2023-02-11 NOTE — Telephone Encounter (Signed)
Letter mailed to pt with pre op information

## 2023-02-19 DIAGNOSIS — I10 Essential (primary) hypertension: Secondary | ICD-10-CM | POA: Diagnosis not present

## 2023-02-19 DIAGNOSIS — R002 Palpitations: Secondary | ICD-10-CM | POA: Diagnosis not present

## 2023-02-19 DIAGNOSIS — Z299 Encounter for prophylactic measures, unspecified: Secondary | ICD-10-CM | POA: Diagnosis not present

## 2023-02-19 DIAGNOSIS — L989 Disorder of the skin and subcutaneous tissue, unspecified: Secondary | ICD-10-CM | POA: Diagnosis not present

## 2023-02-19 DIAGNOSIS — Z2821 Immunization not carried out because of patient refusal: Secondary | ICD-10-CM | POA: Diagnosis not present

## 2023-02-19 DIAGNOSIS — D4819 Other specified neoplasm of uncertain behavior of connective and other soft tissue: Secondary | ICD-10-CM | POA: Diagnosis not present

## 2023-02-19 DIAGNOSIS — R87611 Atypical squamous cells cannot exclude high grade squamous intraepithelial lesion on cytologic smear of cervix (ASC-H): Secondary | ICD-10-CM | POA: Diagnosis not present

## 2023-02-24 ENCOUNTER — Encounter (HOSPITAL_COMMUNITY): Payer: Medicare Other

## 2023-02-24 ENCOUNTER — Encounter (HOSPITAL_COMMUNITY): Payer: Self-pay

## 2023-02-24 ENCOUNTER — Encounter (HOSPITAL_COMMUNITY)
Admission: RE | Admit: 2023-02-24 | Discharge: 2023-02-24 | Disposition: A | Discharge status: Home / Self Care | Payer: Medicare Other | Source: Ambulatory Visit | Attending: "Endocrinology | Admitting: "Endocrinology

## 2023-02-24 NOTE — Patient Instructions (Signed)
   Your procedure is scheduled on: 02/26/2023  Report to [facility] Main Entrance at   8:40  AM.  Call this number if you have problems the morning of surgery: [phone]   Remember:              Follow Directions on the letter you received from Your Physician's office regarding the Bowel Prep              No Smoking the day of Procedure :   Take these medicines the morning of surgery with A SIP OF WATER: none   Do not wear jewelry, make-up or nail polish.    Do not bring valuables to the hospital.  Contacts, dentures or bridgework may not be worn into surgery.  .   Patients discharged the day of surgery will not be allowed to drive home.     Colonoscopy, Adult, Care After This sheet gives you information about how to care for yourself after your procedure. Your health care provider may also give you more specific instructions. If you have problems or questions, contact your health care provider. What can I expect after the procedure? After the procedure, it is common to have: A small amount of blood in your stool for 24 hours after the procedure. Some gas. Mild abdominal cramping or bloating.  Follow these instructions at home: General instructions  For the first 24 hours after the procedure: Do not drive or use machinery. Do not sign important documents. Do not drink alcohol. Do your regular daily activities at a slower pace than normal. Eat soft, easy-to-digest foods. Rest often. Take over-the-counter or prescription medicines only as told by your health care provider. It is up to you to get the results of your procedure. Ask your health care provider, or the department performing the procedure, when your results will be ready. Relieving cramping and bloating Try walking around when you have cramps or feel bloated. Apply heat to your abdomen as told by your health care provider. Use a heat source that your health care provider recommends, such as a moist heat pack or a  heating pad. Place a towel between your skin and the heat source. Leave the heat on for 20-30 minutes. Remove the heat if your skin turns bright red. This is especially important if you are unable to feel pain, heat, or cold. You may have a greater risk of getting burned. Eating and drinking Drink enough fluid to keep your urine clear or pale yellow. Resume your normal diet as instructed by your health care provider. Avoid heavy or fried foods that are hard to digest. Avoid drinking alcohol for as long as instructed by your health care provider. Contact a health care provider if: You have blood in your stool 2-3 days after the procedure. Get help right away if: You have more than a small spotting of blood in your stool. You pass large blood clots in your stool. Your abdomen is swollen. You have nausea or vomiting. You have a fever. You have increasing abdominal pain that is not relieved with medicine. This information is not intended to replace advice given to you by your health care provider. Make sure you discuss any questions you have with your health care provider. Document Released: 12/25/2003 Document Revised: 02/04/2016 Document Reviewed: 07/24/2015 Elsevier Interactive Patient Education  [employer].

## 2023-02-26 ENCOUNTER — Encounter (HOSPITAL_COMMUNITY): Payer: Self-pay

## 2023-02-26 ENCOUNTER — Encounter (HOSPITAL_COMMUNITY)
Admission: RE | Disposition: A | Discharge status: Home / Self Care | Payer: Self-pay | Source: Home / Self Care | Attending: Gastroenterology

## 2023-02-26 ENCOUNTER — Ambulatory Visit (HOSPITAL_COMMUNITY)
Admission: RE | Admit: 2023-02-26 | Discharge: 2023-02-26 | Disposition: A | Discharge status: Home / Self Care | Payer: Medicare Other | Attending: Gastroenterology | Admitting: Gastroenterology

## 2023-02-26 ENCOUNTER — Ambulatory Visit (HOSPITAL_COMMUNITY): Payer: Medicare Other | Admitting: Certified Registered"

## 2023-02-26 DIAGNOSIS — K625 Hemorrhage of anus and rectum: Secondary | ICD-10-CM | POA: Diagnosis not present

## 2023-02-26 DIAGNOSIS — K5730 Diverticulosis of large intestine without perforation or abscess without bleeding: Secondary | ICD-10-CM

## 2023-02-26 DIAGNOSIS — I998 Other disorder of circulatory system: Secondary | ICD-10-CM

## 2023-02-26 DIAGNOSIS — K621 Rectal polyp: Secondary | ICD-10-CM

## 2023-02-26 DIAGNOSIS — K644 Residual hemorrhoidal skin tags: Secondary | ICD-10-CM | POA: Diagnosis not present

## 2023-02-26 DIAGNOSIS — K648 Other hemorrhoids: Secondary | ICD-10-CM | POA: Diagnosis not present

## 2023-02-26 DIAGNOSIS — K5520 Angiodysplasia of colon without hemorrhage: Secondary | ICD-10-CM | POA: Diagnosis not present

## 2023-02-26 DIAGNOSIS — K921 Melena: Secondary | ICD-10-CM | POA: Diagnosis not present

## 2023-02-26 DIAGNOSIS — K5731 Diverticulosis of large intestine without perforation or abscess with bleeding: Secondary | ICD-10-CM | POA: Diagnosis not present

## 2023-02-26 DIAGNOSIS — I10 Essential (primary) hypertension: Secondary | ICD-10-CM | POA: Insufficient documentation

## 2023-02-26 HISTORY — PX: POLYPECTOMY: SHX5525

## 2023-02-26 HISTORY — PX: HOT HEMOSTASIS: SHX5433

## 2023-02-26 HISTORY — PX: COLONOSCOPY WITH PROPOFOL: SHX5780

## 2023-02-26 LAB — HM COLONOSCOPY

## 2023-02-26 SURGERY — COLONOSCOPY WITH PROPOFOL
Anesthesia: General

## 2023-02-26 MED ORDER — PROPOFOL 1000 MG/100ML IV EMUL
INTRAVENOUS | Status: AC
  Filled 2023-02-26: qty 100

## 2023-02-26 MED ORDER — PROPOFOL 500 MG/50ML IV EMUL
INTRAVENOUS | Status: DC | PRN
  Administered 2023-02-26: 150 ug/kg/min via INTRAVENOUS

## 2023-02-26 MED ORDER — LIDOCAINE HCL (CARDIAC) PF 100 MG/5ML IV SOSY
PREFILLED_SYRINGE | INTRAVENOUS | Status: DC | PRN
  Administered 2023-02-26: 50 mg via INTRAVENOUS

## 2023-02-26 MED ORDER — PROPOFOL 10 MG/ML IV BOLUS
INTRAVENOUS | Status: DC | PRN
  Administered 2023-02-26: 100 mg via INTRAVENOUS

## 2023-02-26 MED ORDER — LACTATED RINGERS IV SOLN: INTRAVENOUS | Status: DC | PRN

## 2023-02-26 NOTE — Transfer of Care (Signed)
Immediate Anesthesia Transfer of Care Note  Patient: Jamie Pope  Procedure(s) Performed: COLONOSCOPY WITH PROPOFOL HOT HEMOSTASIS (ARGON PLASMA COAGULATION/BICAP) POLYPECTOMY  Patient Location: Short Stay  Anesthesia Type:General  Level of Consciousness: awake  Airway & Oxygen Therapy: Patient Spontanous Breathing  Post-op Assessment: Report given to RN  Post vital signs: Reviewed and stable  Last Vitals:  Vitals Value Taken Time  BP 106/67 02/26/23 0938  Temp 36.4 C 02/26/23 0938  Pulse 71 02/26/23 0938  Resp 23 02/26/23 0938  SpO2 100 % 02/26/23 0938    Last Pain:  Vitals:   02/26/23 0938  TempSrc: Oral  PainSc: 0-No pain         Complications: No notable events documented.

## 2023-02-26 NOTE — Discharge Instructions (Signed)

## 2023-02-26 NOTE — Op Note (Signed)
[facility] Patient Name: Jamie Pope Procedure Date: 02/26/2023 8:55 AM MRN: [id number] Date of Birth: 1945/10/04 Attending MD: Sanjuan Dame , MD, [id number] CSN: [id number] Age: 77 Admit Type: Outpatient Procedure:                Colonoscopy Indications:              Hematochezia Providers:                Sanjuan Dame, MD, Buel Ream. Thomasena Edis RN, RN,                            Elinor Parkinson Referring MD:              Medicines:                Monitored Anesthesia Care Complications:            No immediate complications. Estimated Blood Loss:     Estimated blood loss: none. Estimated blood loss                            was minimal. Procedure:                Pre-Anesthesia Assessment:                           - Prior to the procedure, a History and Physical                            was performed, and patient medications and                            allergies were reviewed. The patient's tolerance of                            previous anesthesia was also reviewed. The risks                            and benefits of the procedure and the sedation                            options and risks were discussed with the patient.                            All questions were answered, and informed consent                            was obtained. Prior Anticoagulants: The patient has                            taken no anticoagulant or antiplatelet agents                            except for aspirin. ASA Grade Assessment: III - A                            patient with severe  systemic disease. After                            reviewing the risks and benefits, the patient was                            deemed in satisfactory condition to undergo the                            procedure.                           After obtaining informed consent, the colonoscope                            was passed under direct vision. Throughout the                            procedure,  the patient's blood pressure, pulse, and                            oxygen saturations were monitored continuously. The                            [id number]-HQ190L ([id number]) scope was introduced through                            the anus and advanced to the the terminal ileum.                            The colonoscopy was performed without difficulty.                            The patient tolerated the procedure well. The                            quality of the bowel preparation was evaluated                            using the BBPS [facility] Bowel Preparation Scale)                            with scores of: Right Colon = 3, Transverse Colon =                            3 and Left Colon = 3 (entire mucosa seen well with                            no residual staining, small fragments of stool or                            opaque liquid). The total BBPS score equals 9. The  terminal ileum, ileocecal valve, appendiceal                            orifice, and rectum were photographed. Scope In: 9:05:56 AM Scope Out: 9:32:04 AM Scope Withdrawal Time: 0 hours 22 minutes 47 seconds  Total Procedure Duration: 0 hours 26 minutes 8 seconds  Findings:      A single medium-sized angioectasia without bleeding was found in the       ascending colon. Coagulation for bleeding prevention using argon plasma       at 0.3 liters/minute and 20 watts was successful.      A 6 mm polyp was found in the rectum. The polyp was sessile. The polyp       was removed with a cold snare. Resection and retrieval were complete.      The entire examined colon appeared normal.      Scattered diverticula were found in the left colon.      Non-bleeding external and internal hemorrhoids were found during       retroflexion.      The terminal ileum appeared normal. Impression:               - A single non-bleeding colonic angioectasia.                            Treated with argon plasma coagulation  (APC).                           - One 6 mm polyp in the rectum, removed with a cold                            snare. Resected and retrieved.                           - The entire examined colon is normal.                           - Diverticulosis in the left colon.                           - Non-bleeding external and internal hemorrhoids.                           - The examined portion of the ileum was normal. Moderate Sedation:      Per Anesthesia Care Recommendation:           - Patient has a contact number available for                            emergencies. The signs and symptoms of potential                            delayed complications were discussed with the                            patient. Return to normal activities tomorrow.  Written discharge instructions were provided to the                            patient.                           - Resume previous diet.                           - Continue present medications.                           - Await pathology results.                           - Repeat colonoscopy in 7-10 years.                           - Return to primary care physician as previously                            scheduled.                           -Consider following up with dermatology for                            perineal irrigation/ erythema Procedure Code(s):        --- Professional ---                           [phone], 59, Colonoscopy, flexible; with control of                            bleeding, any method                           45385, Colonoscopy, flexible; with removal of                            tumor(s), polyp(s), or other lesion(s) by snare                            technique Diagnosis Code(s):        --- Professional ---                           O13.08, Angiodysplasia of colon without hemorrhage                           D12.8, Benign neoplasm of rectum                           K64.8, Other  hemorrhoids                           K92.1, Melena (includes Hematochezia)  K57.30, Diverticulosis of large intestine without                            perforation or abscess without bleeding CPT copyright 2022 [employer]. All rights reserved. The codes documented in this report are preliminary and upon coder review may  be revised to meet current compliance requirements. Sanjuan Dame, MD Sanjuan Dame, MD 02/26/2023 9:39:18 AM This report has been signed electronically. Number of Addenda: 0

## 2023-02-26 NOTE — Anesthesia Postprocedure Evaluation (Signed)
Anesthesia Post Note  Patient: Jamie Pope  Procedure(s) Performed: COLONOSCOPY WITH PROPOFOL HOT HEMOSTASIS (ARGON PLASMA COAGULATION/BICAP) POLYPECTOMY  Patient location during evaluation: Short Stay Anesthesia Type: General Level of consciousness: awake and alert Pain management: pain level controlled Vital Signs Assessment: post-procedure vital signs reviewed and stable Respiratory status: spontaneous breathing Cardiovascular status: stable Postop Assessment: no apparent nausea or vomiting Anesthetic complications: no   No notable events documented.   Last Vitals:  Vitals:   02/26/23 0827 02/26/23 0938  BP: (!) 165/84 106/67  Pulse: 75 71  Resp: (!) 24 (!) 23  Temp: 36.7 C (!) 36.4 C  SpO2: (!) 23% 100%    Last Pain:  Vitals:   02/26/23 0938  TempSrc: Oral  PainSc: 0-No pain                 Hana Trippett

## 2023-02-26 NOTE — Interval H&P Note (Signed)
History and Physical Interval Note:  02/26/2023 8:18 AM  Jamie Pope  has presented today for surgery, with the diagnosis of hematochezia.  The various methods of treatment have been discussed with the patient and family. After consideration of risks, benefits and other options for treatment, the patient has consented to  Procedure(s) with comments: COLONOSCOPY WITH PROPOFOL (N/A) - 10:15am;asa 3 as a surgical intervention.  The patient's history has been reviewed, patient examined, no change in status, stable for surgery.  I have reviewed the patient's chart and labs.  Questions were answered to the patient's satisfaction.     Juanetta Beets Rhian Funari

## 2023-02-26 NOTE — Anesthesia Preprocedure Evaluation (Signed)
Anesthesia Evaluation  Patient identified by MRN, date of birth, ID band Patient awake    Reviewed: Allergy & Precautions, H&P , NPO status , Patient's Chart, lab work & pertinent test results, reviewed documented beta blocker date and time   Airway Mallampati: II  TM Distance: >3 FB Neck ROM: full    Dental no notable dental hx. (+) Dental Advisory Given   Pulmonary neg pulmonary ROS   Pulmonary exam normal breath sounds clear to auscultation       Cardiovascular Exercise Tolerance: Good negative cardio ROS Normal cardiovascular exam Rhythm:regular Rate:Normal     Neuro/Psych negative neurological ROS  negative psych ROS   GI/Hepatic negative GI ROS, Neg liver ROS,,,  Endo/Other  negative endocrine ROS    Renal/GU negative Renal ROS  negative genitourinary   Musculoskeletal   Abdominal   Peds  Hematology negative hematology ROS (+)   Anesthesia Other Findings   Reproductive/Obstetrics negative OB ROS                             Anesthesia Physical Anesthesia Plan  ASA: 2  Anesthesia Plan: General   Post-op Pain Management: Minimal or no pain anticipated   Induction: Intravenous  PONV Risk Score and Plan: Propofol infusion  Airway Management Planned: Nasal Cannula and Natural Airway  Additional Equipment: None  Intra-op Plan:   Post-operative Plan:   Informed Consent: I have reviewed the patients History and Physical, chart, labs and discussed the procedure including the risks, benefits and alternatives for the proposed anesthesia with the patient or authorized representative who has indicated his/her understanding and acceptance.     Dental Advisory Given  Plan Discussed with: CRNA  Anesthesia Plan Comments:        Anesthesia Quick Evaluation

## 2023-02-27 ENCOUNTER — Encounter (INDEPENDENT_AMBULATORY_CARE_PROVIDER_SITE_OTHER): Payer: Self-pay | Admitting: *Deleted

## 2023-02-27 DIAGNOSIS — R54 Age-related physical debility: Secondary | ICD-10-CM | POA: Diagnosis not present

## 2023-02-27 DIAGNOSIS — C44621 Squamous cell carcinoma of skin of unspecified upper limb, including shoulder: Secondary | ICD-10-CM | POA: Diagnosis not present

## 2023-02-27 DIAGNOSIS — I10 Essential (primary) hypertension: Secondary | ICD-10-CM | POA: Diagnosis not present

## 2023-02-27 DIAGNOSIS — Z299 Encounter for prophylactic measures, unspecified: Secondary | ICD-10-CM | POA: Diagnosis not present

## 2023-03-02 LAB — SURGICAL PATHOLOGY

## 2023-03-04 ENCOUNTER — Encounter (INDEPENDENT_AMBULATORY_CARE_PROVIDER_SITE_OTHER): Payer: Self-pay | Admitting: *Deleted

## 2023-03-06 ENCOUNTER — Encounter (HOSPITAL_COMMUNITY): Payer: Self-pay | Admitting: Gastroenterology

## 2023-03-24 ENCOUNTER — Ambulatory Visit: Payer: Medicare Other | Admitting: Dermatology

## 2023-03-31 DIAGNOSIS — Z299 Encounter for prophylactic measures, unspecified: Secondary | ICD-10-CM | POA: Diagnosis not present

## 2023-03-31 DIAGNOSIS — G250 Essential tremor: Secondary | ICD-10-CM | POA: Diagnosis not present

## 2023-03-31 DIAGNOSIS — C50919 Malignant neoplasm of unspecified site of unspecified female breast: Secondary | ICD-10-CM | POA: Diagnosis not present

## 2023-03-31 DIAGNOSIS — C44621 Squamous cell carcinoma of skin of unspecified upper limb, including shoulder: Secondary | ICD-10-CM | POA: Diagnosis not present

## 2023-03-31 DIAGNOSIS — I10 Essential (primary) hypertension: Secondary | ICD-10-CM | POA: Diagnosis not present

## 2023-04-14 DIAGNOSIS — L820 Inflamed seborrheic keratosis: Secondary | ICD-10-CM | POA: Diagnosis not present

## 2023-04-14 DIAGNOSIS — D485 Neoplasm of uncertain behavior of skin: Secondary | ICD-10-CM | POA: Diagnosis not present

## 2023-04-14 DIAGNOSIS — L570 Actinic keratosis: Secondary | ICD-10-CM | POA: Diagnosis not present

## 2023-05-11 DIAGNOSIS — Z299 Encounter for prophylactic measures, unspecified: Secondary | ICD-10-CM | POA: Diagnosis not present

## 2023-05-11 DIAGNOSIS — R0981 Nasal congestion: Secondary | ICD-10-CM | POA: Diagnosis not present

## 2023-05-11 DIAGNOSIS — J069 Acute upper respiratory infection, unspecified: Secondary | ICD-10-CM | POA: Diagnosis not present

## 2023-06-16 DIAGNOSIS — Z1231 Encounter for screening mammogram for malignant neoplasm of breast: Secondary | ICD-10-CM | POA: Diagnosis not present

## 2023-06-16 DIAGNOSIS — D0511 Intraductal carcinoma in situ of right breast: Secondary | ICD-10-CM | POA: Diagnosis not present

## 2023-07-01 DIAGNOSIS — D0511 Intraductal carcinoma in situ of right breast: Secondary | ICD-10-CM | POA: Diagnosis not present

## 2023-07-02 DIAGNOSIS — Z853 Personal history of malignant neoplasm of breast: Secondary | ICD-10-CM | POA: Diagnosis not present

## 2023-07-02 DIAGNOSIS — Z299 Encounter for prophylactic measures, unspecified: Secondary | ICD-10-CM | POA: Diagnosis not present

## 2023-07-02 DIAGNOSIS — I10 Essential (primary) hypertension: Secondary | ICD-10-CM | POA: Diagnosis not present

## 2023-07-02 DIAGNOSIS — M7701 Medial epicondylitis, right elbow: Secondary | ICD-10-CM | POA: Diagnosis not present

## 2023-07-20 DIAGNOSIS — I10 Essential (primary) hypertension: Secondary | ICD-10-CM | POA: Diagnosis not present

## 2023-07-20 DIAGNOSIS — J069 Acute upper respiratory infection, unspecified: Secondary | ICD-10-CM | POA: Diagnosis not present

## 2023-07-20 DIAGNOSIS — R059 Cough, unspecified: Secondary | ICD-10-CM | POA: Diagnosis not present

## 2023-07-20 DIAGNOSIS — Z299 Encounter for prophylactic measures, unspecified: Secondary | ICD-10-CM | POA: Diagnosis not present

## 2023-08-06 DIAGNOSIS — E559 Vitamin D deficiency, unspecified: Secondary | ICD-10-CM | POA: Diagnosis not present

## 2023-08-06 DIAGNOSIS — Z1339 Encounter for screening examination for other mental health and behavioral disorders: Secondary | ICD-10-CM | POA: Diagnosis not present

## 2023-08-06 DIAGNOSIS — Z299 Encounter for prophylactic measures, unspecified: Secondary | ICD-10-CM | POA: Diagnosis not present

## 2023-08-06 DIAGNOSIS — I10 Essential (primary) hypertension: Secondary | ICD-10-CM | POA: Diagnosis not present

## 2023-08-06 DIAGNOSIS — E7800 Pure hypercholesterolemia, unspecified: Secondary | ICD-10-CM | POA: Diagnosis not present

## 2023-08-06 DIAGNOSIS — R5383 Other fatigue: Secondary | ICD-10-CM | POA: Diagnosis not present

## 2023-08-06 DIAGNOSIS — Z79899 Other long term (current) drug therapy: Secondary | ICD-10-CM | POA: Diagnosis not present

## 2023-08-06 DIAGNOSIS — Z1331 Encounter for screening for depression: Secondary | ICD-10-CM | POA: Diagnosis not present

## 2023-08-06 DIAGNOSIS — Z7189 Other specified counseling: Secondary | ICD-10-CM | POA: Diagnosis not present

## 2023-08-06 DIAGNOSIS — Z0000 Encounter for general adult medical examination without abnormal findings: Secondary | ICD-10-CM | POA: Diagnosis not present

## 2023-08-26 DIAGNOSIS — J302 Other seasonal allergic rhinitis: Secondary | ICD-10-CM | POA: Diagnosis not present

## 2023-08-26 DIAGNOSIS — J329 Chronic sinusitis, unspecified: Secondary | ICD-10-CM | POA: Diagnosis not present

## 2023-08-26 DIAGNOSIS — Z299 Encounter for prophylactic measures, unspecified: Secondary | ICD-10-CM | POA: Diagnosis not present

## 2023-08-26 DIAGNOSIS — I10 Essential (primary) hypertension: Secondary | ICD-10-CM | POA: Diagnosis not present

## 2023-09-18 DIAGNOSIS — I10 Essential (primary) hypertension: Secondary | ICD-10-CM | POA: Diagnosis not present

## 2023-09-18 DIAGNOSIS — L858 Other specified epidermal thickening: Secondary | ICD-10-CM | POA: Diagnosis not present

## 2023-09-18 DIAGNOSIS — Z299 Encounter for prophylactic measures, unspecified: Secondary | ICD-10-CM | POA: Diagnosis not present

## 2023-10-12 DIAGNOSIS — L989 Disorder of the skin and subcutaneous tissue, unspecified: Secondary | ICD-10-CM | POA: Diagnosis not present

## 2023-10-14 DIAGNOSIS — L910 Hypertrophic scar: Secondary | ICD-10-CM | POA: Diagnosis not present

## 2023-10-14 DIAGNOSIS — C44621 Squamous cell carcinoma of skin of unspecified upper limb, including shoulder: Secondary | ICD-10-CM | POA: Diagnosis not present

## 2023-10-14 DIAGNOSIS — L578 Other skin changes due to chronic exposure to nonionizing radiation: Secondary | ICD-10-CM | POA: Diagnosis not present

## 2023-10-14 DIAGNOSIS — C44629 Squamous cell carcinoma of skin of left upper limb, including shoulder: Secondary | ICD-10-CM | POA: Diagnosis not present

## 2023-10-27 DIAGNOSIS — R079 Chest pain, unspecified: Secondary | ICD-10-CM | POA: Diagnosis not present

## 2023-10-27 DIAGNOSIS — I10 Essential (primary) hypertension: Secondary | ICD-10-CM | POA: Diagnosis not present

## 2023-10-27 DIAGNOSIS — R0981 Nasal congestion: Secondary | ICD-10-CM | POA: Diagnosis not present

## 2023-10-27 DIAGNOSIS — Z299 Encounter for prophylactic measures, unspecified: Secondary | ICD-10-CM | POA: Diagnosis not present

## 2023-11-10 DIAGNOSIS — R0600 Dyspnea, unspecified: Secondary | ICD-10-CM | POA: Diagnosis not present

## 2023-11-10 DIAGNOSIS — R079 Chest pain, unspecified: Secondary | ICD-10-CM | POA: Diagnosis not present

## 2023-12-14 DIAGNOSIS — C44629 Squamous cell carcinoma of skin of left upper limb, including shoulder: Secondary | ICD-10-CM | POA: Diagnosis not present

## 2023-12-21 DIAGNOSIS — R072 Precordial pain: Secondary | ICD-10-CM | POA: Diagnosis not present

## 2023-12-21 DIAGNOSIS — R079 Chest pain, unspecified: Secondary | ICD-10-CM | POA: Diagnosis not present

## 2023-12-28 DIAGNOSIS — K449 Diaphragmatic hernia without obstruction or gangrene: Secondary | ICD-10-CM | POA: Diagnosis not present

## 2023-12-28 DIAGNOSIS — R072 Precordial pain: Secondary | ICD-10-CM | POA: Diagnosis not present

## 2023-12-29 DIAGNOSIS — L989 Disorder of the skin and subcutaneous tissue, unspecified: Secondary | ICD-10-CM | POA: Diagnosis not present

## 2023-12-29 DIAGNOSIS — D0511 Intraductal carcinoma in situ of right breast: Secondary | ICD-10-CM | POA: Diagnosis not present

## 2023-12-29 DIAGNOSIS — Z86000 Personal history of in-situ neoplasm of breast: Secondary | ICD-10-CM | POA: Diagnosis not present

## 2024-02-05 DIAGNOSIS — I081 Rheumatic disorders of both mitral and tricuspid valves: Secondary | ICD-10-CM | POA: Diagnosis not present

## 2024-02-05 DIAGNOSIS — I340 Nonrheumatic mitral (valve) insufficiency: Secondary | ICD-10-CM | POA: Diagnosis not present

## 2024-02-05 DIAGNOSIS — I361 Nonrheumatic tricuspid (valve) insufficiency: Secondary | ICD-10-CM | POA: Diagnosis not present

## 2024-03-02 DIAGNOSIS — L858 Other specified epidermal thickening: Secondary | ICD-10-CM | POA: Diagnosis not present

## 2024-03-02 DIAGNOSIS — I10 Essential (primary) hypertension: Secondary | ICD-10-CM | POA: Diagnosis not present

## 2024-03-02 DIAGNOSIS — Z299 Encounter for prophylactic measures, unspecified: Secondary | ICD-10-CM | POA: Diagnosis not present

## 2024-03-14 DIAGNOSIS — Z08 Encounter for follow-up examination after completed treatment for malignant neoplasm: Secondary | ICD-10-CM | POA: Diagnosis not present

## 2024-03-14 DIAGNOSIS — Z85828 Personal history of other malignant neoplasm of skin: Secondary | ICD-10-CM | POA: Diagnosis not present

## 2024-03-14 DIAGNOSIS — L570 Actinic keratosis: Secondary | ICD-10-CM | POA: Diagnosis not present
